# Patient Record
Sex: Male | Born: 1940 | Race: White | Hispanic: No | Marital: Married | Smoking: Former smoker
Health system: Southern US, Community
[De-identification: ages and names within clinical notes are randomized; demographics above are authoritative.]

## PROBLEM LIST (undated history)

## (undated) DIAGNOSIS — E119 Type 2 diabetes mellitus without complications: Secondary | ICD-10-CM

## (undated) DIAGNOSIS — Z77090 Contact with and (suspected) exposure to asbestos: Secondary | ICD-10-CM

## (undated) DIAGNOSIS — J439 Emphysema, unspecified: Secondary | ICD-10-CM

## (undated) HISTORY — PX: APPENDECTOMY: SHX54

---

## 2019-04-11 ENCOUNTER — Other Ambulatory Visit: Payer: Self-pay | Admitting: Family Medicine

## 2019-04-11 ENCOUNTER — Other Ambulatory Visit (HOSPITAL_COMMUNITY): Payer: Self-pay | Admitting: Family Medicine

## 2019-04-11 DIAGNOSIS — R9389 Abnormal findings on diagnostic imaging of other specified body structures: Secondary | ICD-10-CM

## 2019-04-27 ENCOUNTER — Other Ambulatory Visit: Payer: Self-pay

## 2019-04-27 ENCOUNTER — Ambulatory Visit (HOSPITAL_COMMUNITY)
Admission: RE | Admit: 2019-04-27 | Discharge: 2019-04-27 | Disposition: A | Discharge status: Home / Self Care | Payer: Medicare Other | Source: Ambulatory Visit | Attending: Family Medicine | Admitting: Family Medicine

## 2019-04-27 DIAGNOSIS — R9389 Abnormal findings on diagnostic imaging of other specified body structures: Secondary | ICD-10-CM | POA: Diagnosis present

## 2019-08-28 ENCOUNTER — Other Ambulatory Visit (HOSPITAL_COMMUNITY): Payer: Self-pay | Admitting: Family Medicine

## 2019-08-28 ENCOUNTER — Other Ambulatory Visit: Payer: Self-pay | Admitting: Family Medicine

## 2019-08-28 DIAGNOSIS — J929 Pleural plaque without asbestos: Secondary | ICD-10-CM

## 2019-09-06 ENCOUNTER — Other Ambulatory Visit: Payer: Self-pay

## 2019-09-06 ENCOUNTER — Ambulatory Visit (HOSPITAL_COMMUNITY)
Admission: RE | Admit: 2019-09-06 | Discharge: 2019-09-06 | Disposition: A | Discharge status: Home / Self Care | Payer: Medicare Other | Source: Ambulatory Visit | Attending: Family Medicine | Admitting: Family Medicine

## 2019-09-06 DIAGNOSIS — J929 Pleural plaque without asbestos: Secondary | ICD-10-CM | POA: Diagnosis not present

## 2019-10-05 ENCOUNTER — Encounter (HOSPITAL_COMMUNITY): Payer: Self-pay

## 2019-10-05 ENCOUNTER — Inpatient Hospital Stay (HOSPITAL_COMMUNITY)
Admission: EM | Admit: 2019-10-05 | Discharge: 2019-10-07 | DRG: 871 | Disposition: A | Discharge status: Home / Self Care | Payer: Medicare Other | Attending: Family Medicine | Admitting: Family Medicine

## 2019-10-05 ENCOUNTER — Emergency Department (HOSPITAL_COMMUNITY): Payer: Medicare Other

## 2019-10-05 ENCOUNTER — Other Ambulatory Visit: Payer: Self-pay

## 2019-10-05 DIAGNOSIS — J439 Emphysema, unspecified: Secondary | ICD-10-CM | POA: Diagnosis present

## 2019-10-05 DIAGNOSIS — E872 Acidosis, unspecified: Secondary | ICD-10-CM | POA: Diagnosis present

## 2019-10-05 DIAGNOSIS — E871 Hypo-osmolality and hyponatremia: Secondary | ICD-10-CM | POA: Diagnosis present

## 2019-10-05 DIAGNOSIS — J189 Pneumonia, unspecified organism: Principal | ICD-10-CM | POA: Diagnosis present

## 2019-10-05 DIAGNOSIS — Z20822 Contact with and (suspected) exposure to covid-19: Secondary | ICD-10-CM | POA: Diagnosis present

## 2019-10-05 DIAGNOSIS — J449 Chronic obstructive pulmonary disease, unspecified: Secondary | ICD-10-CM | POA: Diagnosis present

## 2019-10-05 DIAGNOSIS — Z77090 Contact with and (suspected) exposure to asbestos: Secondary | ICD-10-CM | POA: Diagnosis present

## 2019-10-05 DIAGNOSIS — J61 Pneumoconiosis due to asbestos and other mineral fibers: Secondary | ICD-10-CM

## 2019-10-05 DIAGNOSIS — A419 Sepsis, unspecified organism: Principal | ICD-10-CM | POA: Diagnosis present

## 2019-10-05 DIAGNOSIS — E119 Type 2 diabetes mellitus without complications: Secondary | ICD-10-CM

## 2019-10-05 DIAGNOSIS — J9601 Acute respiratory failure with hypoxia: Secondary | ICD-10-CM | POA: Diagnosis present

## 2019-10-05 DIAGNOSIS — I959 Hypotension, unspecified: Secondary | ICD-10-CM | POA: Diagnosis present

## 2019-10-05 DIAGNOSIS — E1165 Type 2 diabetes mellitus with hyperglycemia: Secondary | ICD-10-CM | POA: Diagnosis present

## 2019-10-05 HISTORY — DX: Emphysema, unspecified: J43.9

## 2019-10-05 HISTORY — DX: Type 2 diabetes mellitus without complications: E11.9

## 2019-10-05 HISTORY — DX: Contact with and (suspected) exposure to asbestos: Z77.090

## 2019-10-05 LAB — PROTIME-INR
INR: 1.3 — ABNORMAL HIGH (ref 0.8–1.2)
Prothrombin Time: 15.5 seconds — ABNORMAL HIGH (ref 11.4–15.2)

## 2019-10-05 LAB — CBC WITH DIFFERENTIAL/PLATELET
Abs Immature Granulocytes: 0.33 10*3/uL — ABNORMAL HIGH (ref 0.00–0.07)
Basophils Absolute: 0 10*3/uL (ref 0.0–0.1)
Basophils Relative: 0 %
Eosinophils Absolute: 0.1 10*3/uL (ref 0.0–0.5)
Eosinophils Relative: 1 %
HCT: 35.1 % — ABNORMAL LOW (ref 39.0–52.0)
Hemoglobin: 11.6 g/dL — ABNORMAL LOW (ref 13.0–17.0)
Immature Granulocytes: 3 %
Lymphocytes Relative: 7 %
Lymphs Abs: 0.9 10*3/uL (ref 0.7–4.0)
MCH: 28.9 pg (ref 26.0–34.0)
MCHC: 33 g/dL (ref 30.0–36.0)
MCV: 87.5 fL (ref 80.0–100.0)
Monocytes Absolute: 0.8 10*3/uL (ref 0.1–1.0)
Monocytes Relative: 6 %
Neutro Abs: 10.9 10*3/uL — ABNORMAL HIGH (ref 1.7–7.7)
Neutrophils Relative %: 83 %
Platelets: 211 10*3/uL (ref 150–400)
RBC: 4.01 MIL/uL — ABNORMAL LOW (ref 4.22–5.81)
RDW: 14 % (ref 11.5–15.5)
WBC: 13.1 10*3/uL — ABNORMAL HIGH (ref 4.0–10.5)
nRBC: 0 % (ref 0.0–0.2)

## 2019-10-05 LAB — COMPREHENSIVE METABOLIC PANEL
ALT: 28 U/L (ref 0–44)
AST: 30 U/L (ref 15–41)
Albumin: 3.1 g/dL — ABNORMAL LOW (ref 3.5–5.0)
Alkaline Phosphatase: 91 U/L (ref 38–126)
Anion gap: 13 (ref 5–15)
BUN: 42 mg/dL — ABNORMAL HIGH (ref 8–23)
CO2: 19 mmol/L — ABNORMAL LOW (ref 22–32)
Calcium: 8.9 mg/dL (ref 8.9–10.3)
Chloride: 97 mmol/L — ABNORMAL LOW (ref 98–111)
Creatinine, Ser: 1.22 mg/dL (ref 0.61–1.24)
GFR calc Af Amer: >60 mL/min (ref >60)
GFR calc non Af Amer: 56 mL/min — ABNORMAL LOW (ref >60)
Glucose, Bld: 153 mg/dL — ABNORMAL HIGH (ref 70–99)
Potassium: 4.1 mmol/L (ref 3.5–5.1)
Sodium: 129 mmol/L — ABNORMAL LOW (ref 135–145)
Total Bilirubin: 1.6 mg/dL — ABNORMAL HIGH (ref 0.3–1.2)
Total Protein: 7.2 g/dL (ref 6.5–8.1)

## 2019-10-05 LAB — GLUCOSE, CAPILLARY
Glucose-Capillary: 111 mg/dL — ABNORMAL HIGH (ref 70–99)
Glucose-Capillary: 184 mg/dL — ABNORMAL HIGH (ref 70–99)

## 2019-10-05 LAB — HEMOGLOBIN A1C
Hgb A1c MFr Bld: 7.3 % — ABNORMAL HIGH (ref 4.8–5.6)
Mean Plasma Glucose: 162.81 mg/dL

## 2019-10-05 LAB — SARS CORONAVIRUS 2 BY RT PCR (HOSPITAL ORDER, PERFORMED IN ~~LOC~~ HOSPITAL LAB): SARS Coronavirus 2: NEGATIVE

## 2019-10-05 LAB — TROPONIN I (HIGH SENSITIVITY)
Troponin I (High Sensitivity): 26 ng/L — ABNORMAL HIGH (ref <18)
Troponin I (High Sensitivity): 26 ng/L — ABNORMAL HIGH (ref <18)

## 2019-10-05 LAB — LACTIC ACID, PLASMA
Lactic Acid, Venous: 2.4 mmol/L (ref 0.5–1.9)
Lactic Acid, Venous: 2.2 mmol/L (ref 0.5–1.9)
Lactic Acid, Venous: 2.7 mmol/L (ref 0.5–1.9)
Lactic Acid, Venous: 2.1 mmol/L (ref 0.5–1.9)

## 2019-10-05 LAB — BRAIN NATRIURETIC PEPTIDE: B Natriuretic Peptide: 274 pg/mL — ABNORMAL HIGH (ref 0.0–100.0)

## 2019-10-05 LAB — APTT: aPTT: 41 seconds — ABNORMAL HIGH (ref 24–36)

## 2019-10-05 LAB — HIV ANTIBODY (ROUTINE TESTING W REFLEX): HIV Screen 4th Generation wRfx: NONREACTIVE

## 2019-10-05 MED ORDER — LACTATED RINGERS IV BOLUS (SEPSIS)
1000.0000 mL | Freq: Once | INTRAVENOUS | Status: AC
  Administered 2019-10-05: 1000 mL via INTRAVENOUS

## 2019-10-05 MED ORDER — SODIUM CHLORIDE 0.9 % IV SOLN: INTRAVENOUS | Status: AC

## 2019-10-05 MED ORDER — ENOXAPARIN SODIUM 40 MG/0.4ML ~~LOC~~ SOLN
40.0000 mg | SUBCUTANEOUS | Status: DC
  Administered 2019-10-05 – 2019-10-06 (×2): 40 mg via SUBCUTANEOUS
  Filled 2019-10-05 (×2): qty 0.4

## 2019-10-05 MED ORDER — INSULIN ASPART 100 UNIT/ML ~~LOC~~ SOLN
0.0000 [IU] | Freq: Three times a day (TID) | SUBCUTANEOUS | Status: DC
  Administered 2019-10-05 – 2019-10-06 (×2): 3 [IU] via SUBCUTANEOUS
  Administered 2019-10-06: 2 [IU] via SUBCUTANEOUS
  Administered 2019-10-07: 5 [IU] via SUBCUTANEOUS
  Administered 2019-10-07: 2 [IU] via SUBCUTANEOUS

## 2019-10-05 MED ORDER — HYDROCOD POLST-CPM POLST ER 10-8 MG/5ML PO SUER: 2.5000 mL | Freq: Two times a day (BID) | ORAL | Status: DC | PRN

## 2019-10-05 MED ORDER — IPRATROPIUM-ALBUTEROL 0.5-2.5 (3) MG/3ML IN SOLN
3.0000 mL | Freq: Four times a day (QID) | RESPIRATORY_TRACT | Status: DC
  Administered 2019-10-05 – 2019-10-07 (×7): 3 mL via RESPIRATORY_TRACT
  Filled 2019-10-05 (×7): qty 3

## 2019-10-05 MED ORDER — ATORVASTATIN CALCIUM 20 MG PO TABS
20.0000 mg | ORAL_TABLET | Freq: Every evening | ORAL | Status: DC
  Administered 2019-10-05 – 2019-10-06 (×2): 20 mg via ORAL
  Filled 2019-10-05 (×2): qty 1

## 2019-10-05 MED ORDER — SODIUM CHLORIDE 0.9 % IV SOLN
2.0000 g | INTRAVENOUS | Status: DC
  Administered 2019-10-06: 2 g via INTRAVENOUS
  Filled 2019-10-05: qty 20

## 2019-10-05 MED ORDER — GUAIFENESIN ER 600 MG PO TB12
600.0000 mg | ORAL_TABLET | Freq: Two times a day (BID) | ORAL | Status: DC
  Administered 2019-10-05 – 2019-10-07 (×4): 600 mg via ORAL
  Filled 2019-10-05 (×4): qty 1

## 2019-10-05 MED ORDER — SODIUM CHLORIDE 0.9 % IV SOLN
500.0000 mg | Freq: Once | INTRAVENOUS | Status: AC
  Administered 2019-10-05: 500 mg via INTRAVENOUS
  Filled 2019-10-05: qty 500

## 2019-10-05 MED ORDER — AZITHROMYCIN 250 MG PO TABS
500.0000 mg | ORAL_TABLET | Freq: Every day | ORAL | Status: DC
  Administered 2019-10-06 – 2019-10-07 (×2): 500 mg via ORAL
  Filled 2019-10-05 (×2): qty 2

## 2019-10-05 MED ORDER — LACTATED RINGERS IV BOLUS (SEPSIS): 1000.0000 mL | Freq: Once | INTRAVENOUS | Status: DC

## 2019-10-05 MED ORDER — INSULIN ASPART 100 UNIT/ML ~~LOC~~ SOLN
0.0000 [IU] | Freq: Every day | SUBCUTANEOUS | Status: DC
  Administered 2019-10-06: 2 [IU] via SUBCUTANEOUS

## 2019-10-05 MED ORDER — SODIUM CHLORIDE 0.9 % IV SOLN
1.0000 g | Freq: Once | INTRAVENOUS | Status: AC
  Administered 2019-10-05: 1 g via INTRAVENOUS
  Filled 2019-10-05: qty 10

## 2019-10-05 MED ORDER — SODIUM CHLORIDE 0.9 % IV BOLUS: 1000.0000 mL | Freq: Once | INTRAVENOUS | Status: DC

## 2019-10-05 MED ORDER — PAROXETINE HCL 20 MG PO TABS
20.0000 mg | ORAL_TABLET | Freq: Every day | ORAL | Status: DC
  Administered 2019-10-05 – 2019-10-07 (×3): 20 mg via ORAL
  Filled 2019-10-05 (×3): qty 1

## 2019-10-05 NOTE — ED Notes (Signed)
CRITICAL VALUE ALERT  Critical Value: Lactic Acid 2.7 Date & Time Notied:  10/05/19 1550  Provider Notified: Dr Maryln Manuel Orders Received/Actions taken: No orders received at this time

## 2019-10-05 NOTE — ED Notes (Signed)
CRITICAL VALUE ALERT  Critical Value:  Lactic Acid 2.4  Date & Time Notied:  10/05/19 1004  Provider Notified: Dr. Judd Lien  Orders Received/Actions taken: EDP notified

## 2019-10-05 NOTE — ED Triage Notes (Signed)
Pt reports sob and cough x 4 days.  Pt says hurts to breathe.  Pt has had both covid vaccines.

## 2019-10-05 NOTE — H&P (Addendum)
History and Physical  [facility]  Dominic [name]:[id number] DOB: 1940-06-05 DOA: 10/05/2019  PCP: Alinda Deem, MD  Patient coming from: Home   I have personally briefly reviewed patient's old medical records in [facility]  Chief Complaint: cough   HPI: Dominic [name] is a 79 y.o. male with medical history significant for COPD (emphysema), type 2 diabetes mellitus, asbestos exposure who presented to ED from home with 4 days of dry hacking cough, chest congestion, shortness of breath, pleuritic chest wall pain, low grade fever and malaise.  Pt reports that he has had both covid vaccines and is fully vaccinated for covid 19.  He reports that his exercise tolerance is diminished and he feels overall much weaker over last several days.    ED Course: pt was tachypneic and hypoxic with pulse ox of 88%, placed on 2L [place] with improvement, temperature 99.7, respirations 23, blood pressure 112/66 pulse ox improved to 95% on 2 L nasal cannula, sodium 1, potassium 4.1, glucose 153, creatinine 1.22, total bilirubin 1.6, BNP 274.0, troponin XX 6, lactic acid 2.4, WBC 13.1, hemoglobin 11.6, platelet count 211.  SARS 2 COVID-19 test negative, portable chest x-ray with patchy left lower lobe pneumonia findings.  Blood cultures x2 taken, IV antibiotics started and admission requested.  Review of Systems: As per HPI otherwise 10 point review of systems negative.   Past Medical History:  Diagnosis Date  . Asbestos exposure   . Diabetes mellitus without complication (HCC)   . Emphysema lung (HCC)     Past Surgical History:  Procedure Laterality Date  . APPENDECTOMY       reports that he has never smoked. He has never used smokeless tobacco. He reports that he does not drink alcohol and does not use drugs.  No Known Allergies  History reviewed. No pertinent family history.  Prior to Admission medications   Not on File    Physical Exam: Vitals:   10/05/19 1000 10/05/19 1030  10/05/19 1100 10/05/19 1130  BP: (!) 93/59 105/65 (!) 90/57 108/65  Pulse: 87 88 88 85  Resp: (!) 25 (!) 25 20 (!) 24  Temp:      TempSrc:      SpO2: 95% 93% 95% 94%  Weight:      Height:       Constitutional: elderly frail male, lying supine on bed, speaking regular sentences, NAD, calm, comfortable Eyes: PERRL, lids and conjunctivae normal ENMT: Mucous membranes are moist. Posterior pharynx clear of any exudate or lesions.Normal dentition.  Neck: normal, supple, no masses, no thyromegaly Respiratory: rales LLL no wheezing, no crackles. Normal respiratory effort. No accessory muscle use.  Cardiovascular: normal s1,s2 sounds,  no murmurs / rubs / gallops. No extremity edema. 2+ pedal pulses. No carotid bruits.  Abdomen: no tenderness, no masses palpated. No hepatosplenomegaly. Bowel sounds positive.  Musculoskeletal: no clubbing / cyanosis. No joint deformity upper and lower extremities. Good ROM, no contractures. Normal muscle tone.  Skin: no rashes, lesions, ulcers. No induration Neurologic: CN 2-12 grossly intact. Sensation intact, DTR normal. Strength 5/5 in all 4.  Psychiatric: Normal judgment and insight. Alert and oriented x 3. Normal mood.   Labs on Admission: I have personally reviewed following labs and imaging studies  CBC: Recent Labs  Lab 10/05/19 0843  WBC 13.1*  NEUTROABS 10.9*  HGB 11.6*  HCT 35.1*  MCV 87.5  PLT 211   Basic Metabolic Panel: Recent Labs  Lab 10/05/19 0843  NA 129*  K 4.1  CL 97*  CO2 19*  GLUCOSE 153*  BUN 42*  CREATININE 1.22  CALCIUM 8.9   GFR: Estimated Creatinine Clearance: 55.7 mL/min (by C-G formula based on SCr of 1.22 mg/dL). Liver Function Tests: Recent Labs  Lab 10/05/19 0843  AST 30  ALT 28  ALKPHOS 91  BILITOT 1.6*  PROT 7.2  ALBUMIN 3.1*   No results for input(s): LIPASE, AMYLASE in the last 168 hours. No results for input(s): AMMONIA in the last 168 hours. Coagulation Profile: No results for input(s): INR,  PROTIME in the last 168 hours. Cardiac Enzymes: No results for input(s): CKTOTAL, CKMB, CKMBINDEX, TROPONINI in the last 168 hours. BNP (last 3 results) No results for input(s): PROBNP in the last 8760 hours. HbA1C: No results for input(s): HGBA1C in the last 72 hours. CBG: No results for input(s): GLUCAP in the last 168 hours. Lipid Profile: No results for input(s): CHOL, HDL, LDLCALC, TRIG, CHOLHDL, LDLDIRECT in the last 72 hours. Thyroid Function Tests: No results for input(s): TSH, T4TOTAL, FREET4, T3FREE, THYROIDAB in the last 72 hours. Anemia Panel: No results for input(s): VITAMINB12, FOLATE, FERRITIN, TIBC, IRON, RETICCTPCT in the last 72 hours. Urine analysis: No results found for: COLORURINE, APPEARANCEUR, LABSPEC, PHURINE, GLUCOSEU, HGBUR, BILIRUBINUR, KETONESUR, PROTEINUR, UROBILINOGEN, NITRITE, LEUKOCYTESUR  Radiological Exams on Admission: DG Chest Portable 1 View  Result Date: 10/05/2019 CLINICAL DATA:  Shortness of breath and cough for 4 days. EXAM: PORTABLE CHEST 1 VIEW COMPARISON:  Chest CT 09/06/2019 FINDINGS: The cardiomediastinal silhouette is within normal limits. 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 Right-sided pleural thickening and small volume loculated pleural fluid are similar to the prior CT. Right basilar parenchymal lung opacities likely correspond to scarring on the CT. There is patchy opacity in the retrocardiac left lower lobe which appears new from the prior CT. There is scarring in the left upper lobe. No pneumothorax or acute osseous abnormality is identified. IMPRESSION: 1. Patchy left lower lobe opacity concerning for pneumonia. 2. Bilateral lung scarring, calcified pleural plaques, and right-sided pleural thickening and small loculated pleural effusion. Electronically Signed   By: Sebastian Ache M.D.   On: 10/05/2019 08:28   Assessment/Plan Principal Problem:   CAP (community acquired pneumonia) Active Problems:   Diabetes mellitus  without complication (HCC)   Emphysema lung (HCC)   Acute respiratory failure with hypoxia (HCC)   Asbestos exposure   1. Acute respiratory failure with hypoxia secondary to community-acquired pneumonia-with patient's advanced age and comorbidities and high mortality risk he is being admitted inpatient for IV antibiotics and supportive care.  Continue Rocephin and azithromycin.  Follow blood cultures.  Continue supplemental oxygen as needed wean as possible. 2. Type 2 diabetes mellitus-check hemoglobin A1c, supplemental sliding scale insulin and CBG monitoring. 3. COPD-duo nebs ordered every 6 hours, mucolytic's and respiratory care. 4. Sepsis secondary to pneumonia -patient presented with tachypnea, hypotension, community-acquired pneumonia and high lactic acid levels.  He is being treated with the sepsis protocol and will follow lactate levels. 5. Lactic acidosis-he is being treated with sepsis protocol IV fluids and follow-up lactic acid levels ordered. 6. Hypotension - BP improved after IV fluid bolus.    DVT prophylaxis: Lovenox Code Status: Full Family Communication: No family present during rounds Disposition Plan: Home Consults called: N/A Admission status: INP  Tarvis Blossom MD [facility] How to contact the [facility] Attending or Consulting provider 7A - 7P or covering provider during after hours 7P -7A, for this patient?  1. Check the care team in [facility]  and look for a) attending/consulting [facility] provider listed and b) the [facility] team listed 2. Log into [email or web address] and use [place]'s universal password to access. If you do not have the password, please contact the hospital operator. 3. Locate the [facility] provider you are looking for under [facility] and page to a number that you can be directly reached. 4. If you still have difficulty reaching the provider, please page the [facility] (Director on Call) for the Hospitalists listed on amion for assistance.  If 7PM-7AM, please  contact night-coverage [email or web address] Password [facility]  10/05/2019, 12:21 PM

## 2019-10-05 NOTE — ED Notes (Signed)
Pt stood up to urinate at the side of the bed and pt remained on 2L of O2 via [place] the entire time. Pt's O2 sat dropped to 88% while standing. Pt returned to bed with 2L O2 still on with O2 sats rising back to normal.

## 2019-10-05 NOTE — ED Provider Notes (Signed)
[facility] EMERGENCY DEPARTMENT Provider Note   CSN: [id number] Arrival date & time: 10/05/19  2263     History Chief Complaint  Patient presents with  . Shortness of Breath    Dominic [name] is a 79 y.o. male.  Patient is an 79 year old male with past medical history of diabetes, asbestosis.  He presents today for evaluation of shortness of breath and cough.  This has been worsening over the past 4 days.  His cough is nonproductive.  He describes pain with inspiration.  He denies any swelling or tenderness in his legs.  He denies fevers or chills.  Patient does report having his Covid vaccine several months ago.  The history is provided by the patient.  Shortness of Breath Severity:  Moderate Onset quality:  Gradual Duration:  4 days Timing:  Constant Progression:  Worsening Chronicity:  New Context: URI   Relieved by:  Nothing Worsened by:  Deep breathing and movement Ineffective treatments:  None tried Associated symptoms: no fever        Past Medical History:  Diagnosis Date  . Asbestos exposure   . Diabetes mellitus without complication (HCC)   . Emphysema lung (HCC)     There are no problems to display for this patient.   Past Surgical History:  Procedure Laterality Date  . APPENDECTOMY         No family history on file.  Social History   Tobacco Use  . Smoking status: Never Smoker  . Smokeless tobacco: Never Used  Substance Use Topics  . Alcohol use: Never  . Drug use: Never    Home Medications Prior to Admission medications   Not on File    Allergies    Patient has no known allergies.  Review of Systems   Review of Systems  Constitutional: Negative for fever.  Respiratory: Positive for shortness of breath.   All other systems reviewed and are negative.   Physical Exam Updated Vital Signs BP 100/62   Pulse 98   Temp 99.7 F (37.6 C) (Oral)   Resp (!) 29   Ht 5\' 11"  (1.803 m)   Wt 90.7 kg   SpO2 95%   BMI 27.89 kg/m    Physical Exam Vitals and nursing note reviewed.  Constitutional:      General: He is not in acute distress.    Appearance: He is well-developed. He is not diaphoretic.  HENT:     Head: Normocephalic and atraumatic.  Cardiovascular:     Rate and Rhythm: Normal rate and regular rhythm.     Heart sounds: No murmur heard.  No friction rub.  Pulmonary:     Effort: Pulmonary effort is normal. No respiratory distress.     Breath sounds: Examination of the left-middle field reveals rales. Examination of the left-lower field reveals rales. Rales present. No wheezing.  Abdominal:     General: Bowel sounds are normal. There is no distension.     Palpations: Abdomen is soft.     Tenderness: There is no abdominal tenderness.  Musculoskeletal:        General: Normal range of motion.     Cervical back: Normal range of motion and neck supple.     Right lower leg: No tenderness. No edema.     Left lower leg: No tenderness. No edema.  Skin:    General: Skin is warm and dry.  Neurological:     Mental Status: He is alert and oriented to person, place, and time.  Coordination: Coordination normal.     ED Results / Procedures / Treatments   Labs (all labs ordered are listed, but only abnormal results are displayed) Labs Reviewed  CULTURE, BLOOD (ROUTINE X 2)  CULTURE, BLOOD (ROUTINE X 2)  SARS CORONAVIRUS 2 BY RT PCR (HOSPITAL ORDER, PERFORMED IN [facility])  CBC WITH DIFFERENTIAL/PLATELET  COMPREHENSIVE METABOLIC PANEL  LACTIC ACID, PLASMA  LACTIC ACID, PLASMA  BRAIN NATRIURETIC PEPTIDE  TROPONIN I (HIGH SENSITIVITY)    EKG EKG Interpretation  Date/Time:  Thursday October 05 2019 08:03:38 EDT Ventricular Rate:  113 PR Interval:    QRS Duration: 99 QT Interval:  318 QTC Calculation: 436 R Axis:   -42 Text Interpretation: Atrial fibrillation Left anterior fascicular block Confirmed by Geoffery Lyons ([id number]) on 10/05/2019 8:47:10 AM   Radiology DG Chest Portable 1  View  Result Date: 10/05/2019 CLINICAL DATA:  Shortness of breath and cough for 4 days. EXAM: PORTABLE CHEST 1 VIEW COMPARISON:  Chest CT 09/06/2019 FINDINGS: The cardiomediastinal silhouette is within normal limits. 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 Right-sided pleural thickening and small volume loculated pleural fluid are similar to the prior CT. Right basilar parenchymal lung opacities likely correspond to scarring on the CT. There is patchy opacity in the retrocardiac left lower lobe which appears new from the prior CT. There is scarring in the left upper lobe. No pneumothorax or acute osseous abnormality is identified. IMPRESSION: 1. Patchy left lower lobe opacity concerning for pneumonia. 2. Bilateral lung scarring, calcified pleural plaques, and right-sided pleural thickening and small loculated pleural effusion. Electronically Signed   By: Sebastian Ache M.D.   On: 10/05/2019 08:28    Procedures Procedures (including critical care time)  Medications Ordered in ED Medications - No data to display  ED Course  I have reviewed the triage vital signs and the nursing notes.  Pertinent labs & imaging results that were available during my care of the patient were reviewed by me and considered in my medical decision making (see chart for details).    MDM Rules/Calculators/A&P  Patient is an 79 year old male presenting with complaints of shortness of breath and cough that is worsening over the past several days.  Patient's work-up reveals a left lower lobe infiltrate.  He is hypoxic with oxygen saturations of 88% and I feel will require admission.  Patient given IV antibiotics and will be admitted to the hospitalist service.  Covid test is negative.  Dominic [name] was evaluated in Emergency Department on 10/06/2019 for the symptoms described in the history of present illness. He was evaluated in the context of the global COVID-19 pandemic, which necessitated consideration that  the patient might be at risk for infection with the SARS-CoV-2 virus that causes COVID-19. Institutional protocols and algorithms that pertain to the evaluation of patients at risk for COVID-19 are in a state of rapid change based on information released by regulatory bodies including the CDC and federal and state organizations. These policies and algorithms were followed during the patient's care in the ED.   Final Clinical Impression(s) / ED Diagnoses Final diagnoses:  None    Rx / DC Orders ED Discharge Orders    None       Geoffery Lyons, MD 10/06/19 [phone]

## 2019-10-06 LAB — BASIC METABOLIC PANEL
Anion gap: 8 (ref 5–15)
BUN: 22 mg/dL (ref 8–23)
CO2: 24 mmol/L (ref 22–32)
Calcium: 8.6 mg/dL — ABNORMAL LOW (ref 8.9–10.3)
Chloride: 105 mmol/L (ref 98–111)
Creatinine, Ser: 0.76 mg/dL (ref 0.61–1.24)
GFR calc Af Amer: >60 mL/min (ref >60)
GFR calc non Af Amer: >60 mL/min (ref >60)
Glucose, Bld: 115 mg/dL — ABNORMAL HIGH (ref 70–99)
Potassium: 4.3 mmol/L (ref 3.5–5.1)
Sodium: 137 mmol/L (ref 135–145)

## 2019-10-06 LAB — CBC WITH DIFFERENTIAL/PLATELET
Abs Immature Granulocytes: 0.07 10*3/uL (ref 0.00–0.07)
Basophils Absolute: 0 10*3/uL (ref 0.0–0.1)
Basophils Relative: 0 %
Eosinophils Absolute: 0 10*3/uL (ref 0.0–0.5)
Eosinophils Relative: 1 %
HCT: 30.7 % — ABNORMAL LOW (ref 39.0–52.0)
Hemoglobin: 9.7 g/dL — ABNORMAL LOW (ref 13.0–17.0)
Immature Granulocytes: 1 %
Lymphocytes Relative: 12 %
Lymphs Abs: 0.9 10*3/uL (ref 0.7–4.0)
MCH: 28.6 pg (ref 26.0–34.0)
MCHC: 31.6 g/dL (ref 30.0–36.0)
MCV: 90.6 fL (ref 80.0–100.0)
Monocytes Absolute: 0.8 10*3/uL (ref 0.1–1.0)
Monocytes Relative: 11 %
Neutro Abs: 6 10*3/uL (ref 1.7–7.7)
Neutrophils Relative %: 75 %
Platelets: 191 10*3/uL (ref 150–400)
RBC: 3.39 MIL/uL — ABNORMAL LOW (ref 4.22–5.81)
RDW: 14.2 % (ref 11.5–15.5)
WBC: 7.9 10*3/uL (ref 4.0–10.5)
nRBC: 0 % (ref 0.0–0.2)

## 2019-10-06 LAB — GLUCOSE, CAPILLARY
Glucose-Capillary: 119 mg/dL — ABNORMAL HIGH (ref 70–99)
Glucose-Capillary: 192 mg/dL — ABNORMAL HIGH (ref 70–99)
Glucose-Capillary: 122 mg/dL — ABNORMAL HIGH (ref 70–99)
Glucose-Capillary: 214 mg/dL — ABNORMAL HIGH (ref 70–99)

## 2019-10-06 LAB — STREP PNEUMONIAE URINARY ANTIGEN: Strep Pneumo Urinary Antigen: NEGATIVE

## 2019-10-06 LAB — MAGNESIUM: Magnesium: 1.8 mg/dL (ref 1.7–2.4)

## 2019-10-06 NOTE — Evaluation (Signed)
Physical Therapy Evaluation Patient Details Name: Dominic [name] MRN: [id number] DOB: 1940-10-22 Today's Date: 10/06/2019   History of Present Illness  Dominic [name] is a 79 y.o. male with medical history significant for COPD (emphysema), type 2 diabetes mellitus, asbestos exposure who presented to ED from home with 4 days of dry hacking cough, chest congestion, shortness of breath, pleuritic chest wall pain, low grade fever and malaise.  Pt reports that he has had both covid vaccines and is fully vaccinated for covid 19.  He reports that his exercise tolerance is diminished and he feels overall much weaker over last several days.    Clinical Impression  Patient functioning near baseline for functional mobility and gait but demonstrates minimal weakness and unsteadiness with gait. Patient O2 sat monitored throughout session today and patient was on 4L initially with saturation between 87-92% while seated EOB. O2 sat while ambulating on 2L decreased to 85% which then increased to 90% on 4L while ambulating. Upon resting, O2 increased to 94% on 4L seated EOB. Patient was limited by fatigue with ambulating today demonstrating impaired activity tolerance. Patient limited for functional mobility as stated below secondary to BLE weakness, fatigue and poor standing balance.  Patient will benefit from continued physical therapy in hospital and recommended venue below to increase strength, balance, endurance for safe ADLs and gait.      Follow Up Recommendations No PT follow up    Equipment Recommendations  None recommended by PT    Recommendations for Other Services       Precautions / Restrictions Precautions Precautions: Fall Restrictions Weight Bearing Restrictions: No      Mobility  Bed Mobility Overal bed mobility: Modified Independent             General bed mobility comments: slightly slow transition to seated EOB with HOB elevated, O2 set at 4LPM upon enterance for session - O2  sat 97-92 while seated  Transfers Overall transfer level: Needs assistance Equipment used: None Transfers: Sit to/from [employer] Sit to Stand: Supervision Stand pivot transfers: Supervision       General transfer comment: minimally unsteady upon standing  Ambulation/Gait Ambulation/Gait assistance: Min guard Gait Distance (Feet): 100 Feet Assistive device: None Gait Pattern/deviations: Step-through pattern;Decreased step length - right;Decreased step length - left;Decreased stride length Gait velocity: decreased   General Gait Details: slightly slow, labored gait without AD; O2 sat monitored at 85% on 2L while ambualting which increased up to 90% on 4L while ambulating, increased to 94 upon returning to seated on 4L  Stairs            Wheelchair Mobility    Modified Rankin (Stroke Patients Only)       Balance Overall balance assessment: Needs assistance Sitting-balance support: No upper extremity supported;Feet supported Sitting balance-Leahy Scale: Good Sitting balance - Comments: seated EOB   Standing balance support: No upper extremity supported Standing balance-Leahy Scale: Fair Standing balance comment: without AD                             Pertinent Vitals/Pain Pain Assessment: No/denies pain    Home Living Family/patient expects to be discharged to:: Private residence Living Arrangements: Spouse/significant other;Children Available Help at Discharge: Family;Available 24 hours/day Type of Home: House       Home Layout: Two level;Able to live on main level with bedroom/bathroom Home Equipment: Dan Humphreys - 2 wheels;Cane - single point;Crutches      Prior Function  Level of Independence: Independent         Comments: Patient states independent with ADL, limited community ambulator, takes care of wife, not on home O2     Hand Dominance        Extremity/Trunk Assessment   Upper Extremity Assessment Upper  Extremity Assessment: Overall WFL for tasks assessed    Lower Extremity Assessment Lower Extremity Assessment: Overall WFL for tasks assessed    Cervical / Trunk Assessment Cervical / Trunk Assessment: Normal  Communication   Communication: No difficulties  Cognition Arousal/Alertness: Awake/alert Behavior During Therapy: WFL for tasks assessed/performed Overall Cognitive Status: Within Functional Limits for tasks assessed                                        General Comments      Exercises     Assessment/Plan    PT Assessment Patient needs continued PT services  PT Problem List Decreased strength;Decreased activity tolerance;Decreased balance;Decreased mobility;Cardiopulmonary status limiting activity       PT Treatment Interventions DME instruction;Balance training;Gait training;Neuromuscular re-education;Stair training;Functional mobility training;Patient/family education;Therapeutic activities;Therapeutic exercise    PT Goals (Current goals can be found in the Care Plan section)  Acute Rehab PT Goals Patient Stated Goal: Return home with family PT Goal Formulation: With patient Time For Goal Achievement: 10/20/19 Potential to Achieve Goals: Good    Frequency Min 3X/week   Barriers to discharge        Co-evaluation               AM-PAC PT "6 Clicks" Mobility  Outcome Measure Help needed turning from your back to your side while in a flat bed without using bedrails?: None Help needed moving from lying on your back to sitting on the side of a flat bed without using bedrails?: None Help needed moving to and from a bed to a chair (including a wheelchair)?: None Help needed standing up from a chair using your arms (e.g., wheelchair or bedside chair)?: None Help needed to walk in hospital room?: A Little Help needed climbing 3-5 steps with a railing? : A Little 6 Click Score: 22    End of Session Equipment Utilized During Treatment: Gait  belt;Oxygen Activity Tolerance: Patient tolerated treatment well;Patient limited by fatigue Patient left: in bed;with family/visitor present;with call bell/phone within reach Nurse Communication: Mobility status PT Visit Diagnosis: Unsteadiness on feet (R26.81);Other abnormalities of gait and mobility (R26.89);Muscle weakness (generalized) (M62.81)    Time: 6834-1962 PT Time Calculation (min) (ACUTE ONLY): 15 min   Charges:   PT Evaluation $PT Eval Moderate Complexity: 1 Mod         10:07 AM, 10/06/19 Wyman Songster PT, DPT Physical Therapist at [facility]

## 2019-10-06 NOTE — Progress Notes (Signed)
PROGRESS NOTE   Dominic [name]  BSW:[id number] DOB: 09-21-1940 DOA: 10/05/2019 PCP: Alinda Deem, MD   Chief Complaint  Patient presents with  . Shortness of Breath    Brief Admission History:  79 y.o. male with medical history significant for COPD (emphysema), type 2 diabetes mellitus, asbestos exposure who presented to ED from home with 4 days of dry hacking cough, chest congestion, shortness of breath, pleuritic chest wall pain, low grade fever and malaise.  Pt reports that he has had both covid vaccines and is fully vaccinated for covid 19.  He reports that his exercise tolerance is diminished and he feels overall much weaker over last several days.    Assessment & Plan:   Principal Problem:   CAP (community acquired pneumonia) Active Problems:   Diabetes mellitus without complication (HCC)   Emphysema lung (HCC)   Acute respiratory failure with hypoxia (HCC)   Asbestos exposure   Sepsis due to pneumonia (HCC)   Lactic acidosis   Hyponatremia   Hypotension   1. Acute respiratory failure with hypoxia secondary to community-acquired pneumonia-with patient's advanced age and comorbidities and high mortality risk he is being admitted inpatient for IV antibiotics and supportive care.  Continue Rocephin and azithromycin.  he is clinically improving.  Anticipate DC home 7/24.  Follow blood cultures.  Wean oxygen as possible. 2. Poorly controlled Type 2 diabetes mellitus-as evidenced by hemoglobin A1c 7.3%, continue supplemental sliding scale insulin and CBG monitoring. 3. COPD-duo nebs ordered every 6 hours, mucolytics and respiratory care. 4. Sepsis secondary to pneumonia -patient presented with tachypnea, hypotension, community-acquired pneumonia and high lactic acid levels.  He was treated with the sepsis protocol and he is much improved today. 5. Lactic acidosis-treated and resolved.  6. Hypotension - BP improved after IV fluid bolus.    DVT prophylaxis: Lovenox Code Status:  Full  Family Communication: grandson at bedside updated Disposition Plan: Home Consults called: N/A Admission status: INP  Status is: Inpatient  Remains inpatient appropriate because:IV treatments appropriate due to intensity of illness or inability to take PO and Inpatient level of care appropriate due to severity of illness  Dispo: The patient is from: Home              Anticipated d/c is to: Home              Anticipated d/c date is: 1 day              Patient currently is medically stable to d/c.  Consultants:  N/a  Procedures:   n/a  Antimicrobials:  Ceftriaxone 7/22>> Azithromycin 7/22>>  Subjective: Pt says he feels like his breathing is better.  He is less SOB today.  No chest pain.  He is ambulating with PT.   Objective: Vitals:   10/06/19 0221 10/06/19 0804 10/06/19 1047 10/06/19 1423  BP: 104/65  (!) 95/63   Pulse: 73  83   Resp: 18  18   Temp: 97.8 F (36.6 C)     TempSrc: Oral     SpO2: 99% 97% 95% 95%  Weight:      Height:        Intake/Output Summary (Last 24 hours) at 10/06/2019 1753 Last data filed at 10/06/2019 0606 Gross per 24 hour  Intake 291 ml  Output 1600 ml  Net -1309 ml   Filed Weights   10/05/19 0800  Weight: 90.7 kg    Examination:  General exam: Appears calm and comfortable  Respiratory system: Clear to auscultation.  Respiratory effort normal. Cardiovascular system: S1 & S2 heard, RRR. No JVD, murmurs, rubs, gallops or clicks. No pedal edema. Gastrointestinal system: Abdomen is nondistended, soft and nontender. No organomegaly or masses felt. Normal bowel sounds heard. Central nervous system: Alert and oriented. No focal neurological deficits. Extremities: Symmetric 5 x 5 power. Skin: No rashes, lesions or ulcers Psychiatry: Judgement and insight appear normal. Mood & affect appropriate.   Data Reviewed: I have personally reviewed following labs and imaging studies  CBC: Recent Labs  Lab 10/05/19 0843 10/06/19 0427    WBC 13.1* 7.9  NEUTROABS 10.9* 6.0  HGB 11.6* 9.7*  HCT 35.1* 30.7*  MCV 87.5 90.6  PLT 211 191    Basic Metabolic Panel: Recent Labs  Lab 10/05/19 0843 10/06/19 0427  NA 129* 137  K 4.1 4.3  CL 97* 105  CO2 19* 24  GLUCOSE 153* 115*  BUN 42* 22  CREATININE 1.22 0.76  CALCIUM 8.9 8.6*  MG  --  1.8    GFR: Estimated Creatinine Clearance: 84.9 mL/min (by C-G formula based on SCr of 0.76 mg/dL).  Liver Function Tests: Recent Labs  Lab 10/05/19 0843  AST 30  ALT 28  ALKPHOS 91  BILITOT 1.6*  PROT 7.2  ALBUMIN 3.1*    CBG: Recent Labs  Lab 10/05/19 1711 10/05/19 2350 10/06/19 0757 10/06/19 1218 10/06/19 1723  GLUCAP 184* 111* 119* 192* 122*    Recent Results (from the past 240 hour(s))  Blood culture (routine x 2)     Status: None (Preliminary result)   Collection Time: 10/05/19  8:43 AM   Specimen: Right Antecubital; Blood  Result Value Ref Range Status   Specimen Description RIGHT ANTECUBITAL  Final   Special Requests   Final    BOTTLES DRAWN AEROBIC AND ANAEROBIC Blood Culture adequate volume   Culture   Final    NO GROWTH < 24 HOURS Performed at [facility], [address], [place], [place] [address]    Report Status PENDING  Incomplete  Blood culture (routine x 2)     Status: None (Preliminary result)   Collection Time: 10/05/19  8:45 AM   Specimen: BLOOD LEFT WRIST  Result Value Ref Range Status   Specimen Description BLOOD LEFT WRIST  Final   Special Requests   Final    BOTTLES DRAWN AEROBIC AND ANAEROBIC Blood Culture results may not be optimal due to an excessive volume of blood received in culture bottles   Culture   Final    NO GROWTH < 24 HOURS Performed at [facility], [address], [place], [place] [address]    Report Status PENDING  Incomplete  SARS Coronavirus 2 by RT PCR (hospital order, performed in [facility] hospital lab) Nasopharyngeal Nasopharyngeal Swab     Status: None   Collection Time: 10/05/19  9:04 AM   Specimen:  Nasopharyngeal Swab  Result Value Ref Range Status   SARS Coronavirus 2 NEGATIVE NEGATIVE Final    Comment: (NOTE) SARS-CoV-2 target nucleic acids are NOT DETECTED.  The SARS-CoV-2 RNA is generally detectable in upper and lower respiratory specimens during the acute phase of infection. The lowest concentration of SARS-CoV-2 viral copies this assay can detect is 250 copies / mL. A negative result does not preclude SARS-CoV-2 infection and should not be used as the sole basis for treatment or other patient management decisions.  A negative result may occur with improper specimen collection / handling, submission of specimen other than nasopharyngeal swab, presence of viral mutation(s)  within the areas targeted by this assay, and inadequate number of viral copies (<250 copies / mL). A negative result must be combined with clinical observations, patient history, and epidemiological information.  Fact Sheet for Patients:   BoilerBrush.com.cy  Fact Sheet for Healthcare Providers: https://pope.com/  This test is not yet approved or  cleared by the [place] FDA and has been authorized for detection and/or diagnosis of SARS-CoV-2 by FDA under an Emergency Use Authorization (EUA).  This EUA will remain in effect (meaning this test can be used) for the duration of the COVID-19 declaration under Section 564(b)(1) of the Act, 21 U.S.C. section 360bbb-3(b)(1), unless the authorization is terminated or revoked sooner.  Performed at [facility], [address], [place], [place] [address]      Radiology Studies: DG Chest Portable 1 View  Result Date: 10/05/2019 CLINICAL DATA:  Shortness of breath and cough for 4 days. EXAM: PORTABLE CHEST 1 VIEW COMPARISON:  Chest CT 09/06/2019 FINDINGS: The cardiomediastinal silhouette is within normal limits. 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 Right-sided pleural thickening and small  volume loculated pleural fluid are similar to the prior CT. Right basilar parenchymal lung opacities likely correspond to scarring on the CT. There is patchy opacity in the retrocardiac left lower lobe which appears new from the prior CT. There is scarring in the left upper lobe. No pneumothorax or acute osseous abnormality is identified. IMPRESSION: 1. Patchy left lower lobe opacity concerning for pneumonia. 2. Bilateral lung scarring, calcified pleural plaques, and right-sided pleural thickening and small loculated pleural effusion. Electronically Signed   By: Sebastian Ache M.D.   On: 10/05/2019 08:28   Scheduled Meds: . atorvastatin  20 mg Oral QPM  . azithromycin  500 mg Oral Daily  . enoxaparin (LOVENOX) injection  40 mg Subcutaneous Q24H  . guaiFENesin  600 mg Oral BID  . insulin aspart  0-15 Units Subcutaneous TID WC  . insulin aspart  0-5 Units Subcutaneous QHS  . ipratropium-albuterol  3 mL Nebulization Q6H  . PARoxetine  20 mg Oral Daily   Continuous Infusions: . cefTRIAXone (ROCEPHIN)  IV 2 g (10/06/19 1244)     LOS: 1 day   Time spent: 22 mins   Natanel Snavely Laural Benes, MD How to contact the [facility] Attending or Consulting provider 7A - 7P or covering provider during after hours 7P -7A, for this patient?  1. Check the care team in [facility] and look for a) attending/consulting TRH provider listed and b) the [facility] team listed 2. Log into [email or web address] and use [facility] universal password to access. If you do not have the password, please contact the hospital operator. 3. Locate the [facility] provider you are looking for under [facility] and page to a number that you can be directly reached. 4. If you still have difficulty reaching the provider, please page the [facility] (Director on Call) for the Hospitalists listed on amion for assistance.  10/06/2019, 5:53 PM

## 2019-10-06 NOTE — Care Management Important Message (Signed)
Important Message  Patient Details  Name: Dominic [name] MRN: [id number] Date of Birth: [birth year]   Medicare Important Message Given:  Yes     Corey Harold 10/06/2019, 2:19 PM

## 2019-10-06 NOTE — Plan of Care (Signed)
  Problem: Acute Rehab PT Goals(only PT should resolve) Goal: Patient Will Transfer Sit To/From Stand Outcome: Progressing Flowsheets (Taken 10/06/2019 1008) Patient will transfer sit to/from stand: Independently Goal: Pt Will Transfer Bed To Chair/Chair To Bed Outcome: Progressing Flowsheets (Taken 10/06/2019 1008) Pt will Transfer Bed to Chair/Chair to Bed: Independently Goal: Pt Will Ambulate Outcome: Progressing Flowsheets (Taken 10/06/2019 1008) Pt will Ambulate:  > 125 feet  with modified independence Goal: Pt/caregiver will Perform Home Exercise Program Outcome: Progressing Flowsheets (Taken 10/06/2019 1008) Pt/caregiver will Perform Home Exercise Program:  For increased strengthening  For improved balance  Independently  10:09 AM, 10/06/19 Wyman Songster PT, DPT Physical Therapist at [facility]

## 2019-10-07 DIAGNOSIS — E871 Hypo-osmolality and hyponatremia: Secondary | ICD-10-CM

## 2019-10-07 LAB — GLUCOSE, CAPILLARY
Glucose-Capillary: 135 mg/dL — ABNORMAL HIGH (ref 70–99)
Glucose-Capillary: 125 mg/dL — ABNORMAL HIGH (ref 70–99)
Glucose-Capillary: 218 mg/dL — ABNORMAL HIGH (ref 70–99)

## 2019-10-07 MED ORDER — IPRATROPIUM-ALBUTEROL 0.5-2.5 (3) MG/3ML IN SOLN: 3.0000 mL | Freq: Three times a day (TID) | RESPIRATORY_TRACT | 0 refills | Status: DC

## 2019-10-07 MED ORDER — GUAIFENESIN ER 600 MG PO TB12: 1200.0000 mg | ORAL_TABLET | Freq: Two times a day (BID) | ORAL | 0 refills | Status: AC

## 2019-10-07 MED ORDER — DOXYCYCLINE HYCLATE 100 MG PO CAPS
100.0000 mg | ORAL_CAPSULE | Freq: Two times a day (BID) | ORAL | 0 refills | Status: AC
Start: 2019-10-07 — End: 2019-10-12

## 2019-10-07 MED ORDER — DEXTROMETHORPHAN POLISTIREX ER 30 MG/5ML PO SUER: 30.0000 mg | Freq: Two times a day (BID) | ORAL | 0 refills | Status: DC | PRN

## 2019-10-07 MED ORDER — ALBUTEROL SULFATE HFA 108 (90 BASE) MCG/ACT IN AERS
2.0000 | INHALATION_SPRAY | RESPIRATORY_TRACT | 2 refills | Status: DC | PRN
Start: 2019-10-07 — End: 2021-03-14

## 2019-10-07 NOTE — TOC Transition Note (Signed)
Transition of Care [facility]) - CM/SW Discharge Note   Patient Details  Name: Ryoma Nofziger MRN: [id number] Date of Birth: Feb 20, 1941  [facility] CM/SW Contact:  Barry Brunner, LCSW Phone Number: 10/07/2019, 12:09 PM   Clinical Narrative:    CSW contacted [employer] to provide patient with a nebulizer and 4 L flow for O2. [employer] agreeable to provide patient with equipment. TOC signing off.            Readmission Risk Interventions No flowsheet data found.

## 2019-10-07 NOTE — Discharge Summary (Signed)
Physician Discharge Summary  Dominic [name]:[id number] DOB: 22-Dec-1940 DOA: 10/05/2019  PCP: Alinda Deem, MD  Admit date: 10/05/2019 Discharge date: 10/07/2019  Admitted From:  Home Disposition:  Home   Recommendations for Outpatient Follow-up:  1. Follow up with PCP in 1 weeks 2. Follow up with pulmonologist as scheduled  Discharge Condition: STABLE   CODE STATUS: FULL    Brief Hospitalization Summary: Please see all hospital notes, images, labs for full details of the hospitalization. ADMISSION HPI: Dominic [name] is a 79 y.o. male with medical history significant for COPD (emphysema), type 2 diabetes mellitus, asbestos exposure who presented to ED from home with 4 days of dry hacking cough, chest congestion, shortness of breath, pleuritic chest wall pain, low grade fever and malaise.  Pt reports that he has had both covid vaccines and is fully vaccinated for covid 19.  He reports that his exercise tolerance is diminished and he feels overall much weaker over last several days.    ED Course: pt was tachypneic and hypoxic with pulse ox of 88%, placed on 2L [place] with improvement, temperature 99.7, respirations 23, blood pressure 112/66 pulse ox improved to 95% on 2 L nasal cannula, sodium 1, potassium 4.1, glucose 153, creatinine 1.22, total bilirubin 1.6, BNP 274.0, troponin XX 6, lactic acid 2.4, WBC 13.1, hemoglobin 11.6, platelet count 211.  SARS 2 COVID-19 test negative, portable chest x-ray with patchy left lower lobe pneumonia findings.  Blood cultures x2 taken, IV antibiotics started and admission requested.  1. Acute respiratory failure with hypoxia secondary to community-acquired pneumonia-RESOLVED. With patient's advanced age and comorbidities and high mortality risk he was admitted inpatient for IV antibiotics and supportive care. He was treated with Rocephin and azithromycin. he is clinically improved.  Anticipate DC home 7/24.  Blood cultures. No growth to date.  He will  qualify for home oxygen and he will be sent home on oxygen as arranged by [facility] service.  He does well on 4L [place].  He has outpatient appt with Dr. [name] pulmonologist in August 2021.  2. Poorly controlled Type 2 diabetes mellitus-as evidenced by hemoglobin A1c 7.3%, treated with supplemental sliding scale insulin and CBG monitoring.  Resume home treatment plan.  3. COPD-duo nebs ordered TID, continue, mucolytics and respiratory care.  Arrange for home nebulizer supplies and duonebs.  4. Sepsis secondary to pneumonia -RESOLVED. Patient presented with tachypnea, hypotension, community-acquired pneumonia and high lactic acid levels. He was treated with the sepsis protocol and he is much improved. 5. Lactic acidosis-treated and resolved.  6. Hypotension - BP improved after IV fluid bolus.  DVT prophylaxis:Lovenox Code Status:Full  Family Communication:grandson at bedside updated Disposition Plan:Home Consults called:N/A Admission status:INP   Discharge Diagnoses:  Principal Problem:   CAP (community acquired pneumonia) Active Problems:   Diabetes mellitus without complication (HCC)   Emphysema lung (HCC)   Acute respiratory failure with hypoxia (HCC)   Asbestos exposure   Sepsis due to pneumonia (HCC)   Lactic acidosis   Hyponatremia   Hypotension   Discharge Instructions:  Allergies as of 10/07/2019   No Known Allergies     Medication List    TAKE these medications   albuterol 108 (90 Base) MCG/ACT inhaler Commonly known as: VENTOLIN HFA Inhale 2 puffs into the lungs every 4 (four) hours as needed for wheezing or shortness of breath (cough, shortness of breath or wheezing.).   atorvastatin 20 MG tablet Commonly known as: LIPITOR Take 20 mg by mouth daily.   dextromethorphan 30 MG/5ML  liquid Commonly known as: Delsym Take 5 mLs (30 mg total) by mouth 2 (two) times daily as needed for cough.   doxycycline 100 MG capsule Commonly known as: VIBRAMYCIN Take 1 capsule  (100 mg total) by mouth 2 (two) times daily for 5 days.   guaiFENesin 600 MG 12 hr tablet Commonly known as: MUCINEX Take 2 tablets (1,200 mg total) by mouth 2 (two) times daily for 5 days.   ipratropium-albuterol 0.5-2.5 (3) MG/3ML Soln Commonly known as: DUONEB Take 3 mLs by nebulization in the morning, at noon, and at bedtime.   lisinopril 2.5 MG tablet Commonly known as: ZESTRIL Take 2.5 mg by mouth daily.   metFORMIN 500 MG tablet Commonly known as: GLUCOPHAGE Take 500 mg by mouth 2 (two) times daily.   PARoxetine 20 MG tablet Commonly known as: PAXIL Take 20 mg by mouth daily.            Durable Medical Equipment  (From admission, onward)         Start     Ordered   10/07/19 1139  For home use only DME Nebulizer machine  Once       Question Answer Comment  Patient needs a nebulizer to treat with the following condition COPD (chronic obstructive pulmonary disease) (HCC)   Length of Need Lifetime      10/07/19 1139   10/07/19 1137  For home use only DME oxygen  Once       Question Answer Comment  Length of Need Lifetime   Mode or (Route) Nasal cannula   Liters per Minute 4   Frequency Continuous (stationary and portable oxygen unit needed)   Oxygen conserving device Yes   Oxygen delivery system Gas      10/07/19 1136          Follow-up Information    Alinda Deem, MD. Schedule an appointment as soon as possible for a visit in 1 week(s).   Specialty: Family Medicine Contact information: [address] [place] [address] [phone]              No Known Allergies Allergies as of 10/07/2019   No Known Allergies     Medication List    TAKE these medications   albuterol 108 (90 Base) MCG/ACT inhaler Commonly known as: VENTOLIN HFA Inhale 2 puffs into the lungs every 4 (four) hours as needed for wheezing or shortness of breath (cough, shortness of breath or wheezing.).   atorvastatin 20 MG tablet Commonly known as: LIPITOR Take 20 mg  by mouth daily.   dextromethorphan 30 MG/5ML liquid Commonly known as: Delsym Take 5 mLs (30 mg total) by mouth 2 (two) times daily as needed for cough.   doxycycline 100 MG capsule Commonly known as: VIBRAMYCIN Take 1 capsule (100 mg total) by mouth 2 (two) times daily for 5 days.   guaiFENesin 600 MG 12 hr tablet Commonly known as: MUCINEX Take 2 tablets (1,200 mg total) by mouth 2 (two) times daily for 5 days.   ipratropium-albuterol 0.5-2.5 (3) MG/3ML Soln Commonly known as: DUONEB Take 3 mLs by nebulization in the morning, at noon, and at bedtime.   lisinopril 2.5 MG tablet Commonly known as: ZESTRIL Take 2.5 mg by mouth daily.   metFORMIN 500 MG tablet Commonly known as: GLUCOPHAGE Take 500 mg by mouth 2 (two) times daily.   PARoxetine 20 MG tablet Commonly known as: PAXIL Take 20 mg by mouth daily.  Durable Medical Equipment  (From admission, onward)         Start     Ordered   10/07/19 1139  For home use only DME Nebulizer machine  Once       Question Answer Comment  Patient needs a nebulizer to treat with the following condition COPD (chronic obstructive pulmonary disease) (HCC)   Length of Need Lifetime      10/07/19 1139   10/07/19 1137  For home use only DME oxygen  Once       Question Answer Comment  Length of Need Lifetime   Mode or (Route) Nasal cannula   Liters per Minute 4   Frequency Continuous (stationary and portable oxygen unit needed)   Oxygen conserving device Yes   Oxygen delivery system Gas      10/07/19 1136          Procedures/Studies: DG Chest Portable 1 View  Result Date: 10/05/2019 CLINICAL DATA:  Shortness of breath and cough for 4 days. EXAM: PORTABLE CHEST 1 VIEW COMPARISON:  Chest CT 09/06/2019 FINDINGS: The cardiomediastinal silhouette is within normal limits. 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 Right-sided pleural thickening and small volume loculated pleural fluid are similar to the prior  CT. Right basilar parenchymal lung opacities likely correspond to scarring on the CT. There is patchy opacity in the retrocardiac left lower lobe which appears new from the prior CT. There is scarring in the left upper lobe. No pneumothorax or acute osseous abnormality is identified. IMPRESSION: 1. Patchy left lower lobe opacity concerning for pneumonia. 2. Bilateral lung scarring, calcified pleural plaques, and right-sided pleural thickening and small loculated pleural effusion. Electronically Signed   By: Sebastian Ache M.D.   On: 10/05/2019 08:28      Subjective: Pt says he feels much better.  The neb treatments help him a lot and he is requesting home nebulizer.  He has appt with Dr. [name] pulmonology clinic to establish care next month.   Discharge Exam: Vitals:   10/07/19 0507 10/07/19 0747  BP: 91/69   Pulse: 72   Resp: 18   Temp: 97.7 F (36.5 C)   SpO2: 98% 98%   Vitals:   10/06/19 2110 10/07/19 0145 10/07/19 0507 10/07/19 0747  BP: 104/68  91/69   Pulse: 80  72   Resp: 18  18   Temp: 98.8 F (37.1 C)  97.7 F (36.5 C)   TempSrc: Oral  Oral   SpO2: 98% 97% 98% 98%  Weight:      Height:       General: Pt is alert, awake, not in acute distress Cardiovascular: normal S1/S2 +, no rubs, no gallops Respiratory: good air movement, minor exp wheezing, no rhonchi Abdominal: Soft, NT, ND, bowel sounds + Extremities: no edema, no cyanosis   The results of significant diagnostics from this hospitalization (including imaging, microbiology, ancillary and laboratory) are listed below for reference.     Microbiology: Recent Results (from the past 240 hour(s))  Blood culture (routine x 2)     Status: None (Preliminary result)   Collection Time: 10/05/19  8:43 AM   Specimen: Right Antecubital; Blood  Result Value Ref Range Status   Specimen Description RIGHT ANTECUBITAL  Final   Special Requests   Final    BOTTLES DRAWN AEROBIC AND ANAEROBIC Blood Culture adequate volume   Culture    Final    NO GROWTH 2 DAYS Performed at [facility], [address], [place], [place] [address]  Report Status PENDING  Incomplete  Blood culture (routine x 2)     Status: None (Preliminary result)   Collection Time: 10/05/19  8:45 AM   Specimen: BLOOD LEFT WRIST  Result Value Ref Range Status   Specimen Description BLOOD LEFT WRIST  Final   Special Requests   Final    BOTTLES DRAWN AEROBIC AND ANAEROBIC Blood Culture results may not be optimal due to an excessive volume of blood received in culture bottles   Culture   Final    NO GROWTH 2 DAYS Performed at [facility], [address], [place], [place] [address]    Report Status PENDING  Incomplete  SARS Coronavirus 2 by RT PCR (hospital order, performed in [facility] hospital lab) Nasopharyngeal Nasopharyngeal Swab     Status: None   Collection Time: 10/05/19  9:04 AM   Specimen: Nasopharyngeal Swab  Result Value Ref Range Status   SARS Coronavirus 2 NEGATIVE NEGATIVE Final    Comment: (NOTE) SARS-CoV-2 target nucleic acids are NOT DETECTED.  The SARS-CoV-2 RNA is generally detectable in upper and lower respiratory specimens during the acute phase of infection. The lowest concentration of SARS-CoV-2 viral copies this assay can detect is 250 copies / mL. A negative result does not preclude SARS-CoV-2 infection and should not be used as the sole basis for treatment or other patient management decisions.  A negative result may occur with improper specimen collection / handling, submission of specimen other than nasopharyngeal swab, presence of viral mutation(s) within the areas targeted by this assay, and inadequate number of viral copies (<250 copies / mL). A negative result must be combined with clinical observations, patient history, and epidemiological information.  Fact Sheet for Patients:   BoilerBrush.com.cy  Fact Sheet for Healthcare Providers: https://pope.com/  This  test is not yet approved or  cleared by the [place] FDA and has been authorized for detection and/or diagnosis of SARS-CoV-2 by FDA under an Emergency Use Authorization (EUA).  This EUA will remain in effect (meaning this test can be used) for the duration of the COVID-19 declaration under Section 564(b)(1) of the Act, 21 U.S.C. section 360bbb-3(b)(1), unless the authorization is terminated or revoked sooner.  Performed at [facility], [address] [place], [place] [address]      Labs: BNP (last 3 results) Recent Labs    10/05/19 0902  BNP 274.0*   Basic Metabolic Panel: Recent Labs  Lab 10/05/19 0843 10/06/19 0427  NA 129* 137  K 4.1 4.3  CL 97* 105  CO2 19* 24  GLUCOSE 153* 115*  BUN 42* 22  CREATININE 1.22 0.76  CALCIUM 8.9 8.6*  MG  --  1.8   Liver Function Tests: Recent Labs  Lab 10/05/19 0843  AST 30  ALT 28  ALKPHOS 91  BILITOT 1.6*  PROT 7.2  ALBUMIN 3.1*   No results for input(s): LIPASE, AMYLASE in the last 168 hours. No results for input(s): AMMONIA in the last 168 hours. CBC: Recent Labs  Lab 10/05/19 0843 10/06/19 0427  WBC 13.1* 7.9  NEUTROABS 10.9* 6.0  HGB 11.6* 9.7*  HCT 35.1* 30.7*  MCV 87.5 90.6  PLT 211 191   Cardiac Enzymes: No results for input(s): CKTOTAL, CKMB, CKMBINDEX, TROPONINI in the last 168 hours. BNP: Invalid input(s): POCBNP CBG: Recent Labs  Lab 10/06/19 1723 10/06/19 2026 10/07/19 0422 10/07/19 0735 10/07/19 1114  GLUCAP 122* 214* 135* 125* 218*   D-Dimer No results for input(s): DDIMER in the last 72 hours.  Hgb A1c Recent Labs    10/05/19 0831  HGBA1C 7.3*   Lipid Profile No results for input(s): CHOL, HDL, LDLCALC, TRIG, CHOLHDL, LDLDIRECT in the last 72 hours. Thyroid function studies No results for input(s): TSH, T4TOTAL, T3FREE, THYROIDAB in the last 72 hours.  Invalid input(s): FREET3 Anemia work up No results for input(s): VITAMINB12, FOLATE, FERRITIN, TIBC, IRON, RETICCTPCT in  the last 72 hours. Urinalysis No results found for: COLORURINE, APPEARANCEUR, LABSPEC, PHURINE, GLUCOSEU, HGBUR, BILIRUBINUR, KETONESUR, PROTEINUR, UROBILINOGEN, NITRITE, LEUKOCYTESUR Sepsis Labs Invalid input(s): PROCALCITONIN,  WBC,  LACTICIDVEN Microbiology Recent Results (from the past 240 hour(s))  Blood culture (routine x 2)     Status: None (Preliminary result)   Collection Time: 10/05/19  8:43 AM   Specimen: Right Antecubital; Blood  Result Value Ref Range Status   Specimen Description RIGHT ANTECUBITAL  Final   Special Requests   Final    BOTTLES DRAWN AEROBIC AND ANAEROBIC Blood Culture adequate volume   Culture   Final    NO GROWTH 2 DAYS Performed at [facility], [address], [place], [place] [address]    Report Status PENDING  Incomplete  Blood culture (routine x 2)     Status: None (Preliminary result)   Collection Time: 10/05/19  8:45 AM   Specimen: BLOOD LEFT WRIST  Result Value Ref Range Status   Specimen Description BLOOD LEFT WRIST  Final   Special Requests   Final    BOTTLES DRAWN AEROBIC AND ANAEROBIC Blood Culture results may not be optimal due to an excessive volume of blood received in culture bottles   Culture   Final    NO GROWTH 2 DAYS Performed at [facility], [address] [place], [place] [address]    Report Status PENDING  Incomplete  SARS Coronavirus 2 by RT PCR (hospital order, performed in [facility] hospital lab) Nasopharyngeal Nasopharyngeal Swab     Status: None   Collection Time: 10/05/19  9:04 AM   Specimen: Nasopharyngeal Swab  Result Value Ref Range Status   SARS Coronavirus 2 NEGATIVE NEGATIVE Final    Comment: (NOTE) SARS-CoV-2 target nucleic acids are NOT DETECTED.  The SARS-CoV-2 RNA is generally detectable in upper and lower respiratory specimens during the acute phase of infection. The lowest concentration of SARS-CoV-2 viral copies this assay can detect is 250 copies / mL. A negative result does not preclude SARS-CoV-2  infection and should not be used as the sole basis for treatment or other patient management decisions.  A negative result may occur with improper specimen collection / handling, submission of specimen other than nasopharyngeal swab, presence of viral mutation(s) within the areas targeted by this assay, and inadequate number of viral copies (<250 copies / mL). A negative result must be combined with clinical observations, patient history, and epidemiological information.  Fact Sheet for Patients:   BoilerBrush.com.cy  Fact Sheet for Healthcare Providers: https://pope.com/  This test is not yet approved or  cleared by the [place] FDA and has been authorized for detection and/or diagnosis of SARS-CoV-2 by FDA under an Emergency Use Authorization (EUA).  This EUA will remain in effect (meaning this test can be used) for the duration of the COVID-19 declaration under Section 564(b)(1) of the Act, 21 U.S.C. section 360bbb-3(b)(1), unless the authorization is terminated or revoked sooner.  Performed at [facility], [address] [place], [place] [address]    Time coordinating discharge: 35 mins   SIGNED:  Standley Dakins, MD  [facility] 10/07/2019, 12:03  PM How to contact the [facility] Attending or Consulting provider 7A - 7P or covering provider during after hours 7P -7A, for this patient?  1. Check the care team in [facility] and look for a) attending/consulting [facility] provider listed and b) the [facility] team listed 2. Log into [email or web address] and use [place]'s universal password to access. If you do not have the password, please contact the hospital operator. 3. Locate the [facility] provider you are looking for under [facility] and page to a number that you can be directly reached. 4. If you still have difficulty reaching the provider, please page the [facility] (Director on Call) for the Hospitalists listed on amion for assistance.

## 2019-10-07 NOTE — Discharge Instructions (Signed)
Community-Acquired Pneumonia, Adult Pneumonia is an infection of the lungs. It causes swelling in the airways of the lungs. Mucus and fluid may also build up inside the airways. One type of pneumonia can happen while a person is in a hospital. A different type can happen when a person is not in a hospital (community-acquired pneumonia).  What are the causes?  This condition is caused by germs (viruses, bacteria, or fungi). Some types of germs can be passed from one person to another. This can happen when you breathe in droplets from the cough or sneeze of an infected person. What increases the risk? You are more likely to develop this condition if you:  Have a long-term (chronic) disease, such as: ? Chronic obstructive pulmonary disease (COPD). ? Asthma. ? Cystic fibrosis. ? Congestive heart failure. ? Diabetes. ? Kidney disease.  Have HIV.  Have sickle cell disease.  Have had your spleen removed.  Do not take good care of your teeth and mouth (poor dental hygiene).  Have a medical condition that increases the risk of breathing in droplets from your own mouth and nose.  Have a weakened body defense system (immune system).  Are a smoker.  Travel to areas where the germs that cause this illness are common.  Are around certain animals or the places they live. What are the signs or symptoms?  A dry cough.  A wet (productive) cough.  Fever.  Sweating.  Chest pain. This often happens when breathing deeply or coughing.  Fast breathing or trouble breathing.  Shortness of breath.  Shaking chills.  Feeling tired (fatigue).  Muscle aches. How is this treated? Treatment for this condition depends on many things. Most adults can be treated at home. In some cases, treatment must happen in a hospital. Treatment may include:  Medicines given by mouth or through an IV tube.  Being given extra oxygen.  Respiratory therapy. In rare cases, treatment for very bad pneumonia  may include:  Using a machine to help you breathe.  Having a procedure to remove fluid from around your lungs. Follow these instructions at home: Medicines  Take over-the-counter and prescription medicines only as told by your doctor. ? Only take cough medicine if you are losing sleep.  If you were prescribed an antibiotic medicine, take it as told by your doctor. Do not stop taking the antibiotic even if you start to feel better. General instructions   Sleep with your head and neck raised (elevated). You can do this by sleeping in a recliner or by putting a few pillows under your head.  Rest as needed. Get at least 8 hours of sleep each night.  Drink enough water to keep your pee (urine) pale yellow.  Eat a healthy diet that includes plenty of vegetables, fruits, whole grains, low-fat dairy products, and lean protein.  Do not use any products that contain nicotine or tobacco. These include cigarettes, e-cigarettes, and chewing tobacco. If you need help quitting, ask your doctor.  Keep all follow-up visits as told by your doctor. This is important. How is this prevented? A shot (vaccine) can help prevent pneumonia. Shots are often suggested for:  People older than 80 years of age.  People older than 80 years of age who: ? Are having cancer treatment. ? Have long-term (chronic) lung disease. ? Have problems with their body's defense system. You may also prevent pneumonia if you take these actions:  Get the flu (influenza) shot every year.  Go to the dentist as   often as told.  Wash your hands often. If you cannot use soap and water, use hand sanitizer. Contact a doctor if:  You have a fever.  You lose sleep because your cough medicine does not help. Get help right away if:  You are short of breath and it gets worse.  You have more chest pain.  Your sickness gets worse. This is very serious if: ? You are an older adult. ? Your body's defense system is weak.  You  cough up blood. Summary  Pneumonia is an infection of the lungs.  Most adults can be treated at home. Some will need treatment in a hospital.  Drink enough water to keep your pee pale yellow.  Get at least 8 hours of sleep each night. This information is not intended to replace advice given to you by your health care provider. Make sure you discuss any questions you have with your health care provider. Document Revised: 06/22/2018 Document Reviewed: 10/28/2017 Elsevier Patient Education  2020 Else[employer] Inc[employer]   IMPORTANT INFORMATION: PAY CLOSE ATTENTION   PHYSICIAN DISCHARGE INSTRUCTIONS  Follow with Primary care provider  Dominic Deem, MD  and other consultants as instructed by your Hospitalist Physician  SEEK MEDICAL CARE OR RETURN TO EMERGENCY ROOM IF SYMPTOMS COME BACK, WORSEN OR NEW PROBLEM DEVELOPS   Please note: You were cared for by a hospitalist during your hospital stay. Every effort will be made to forward records to your primary care provider.  You can request that your primary care provider send for your hospital records if they have not received them.  Once you are discharged, your primary care physician will handle any further medical issues. Please note that NO REFILLS for any discharge medications will be authorized once you are discharged, as it is imperative that you return to your primary care physician (or establish a relationship with a primary care physician if you do not have one) for your post hospital discharge needs so that they can reassess your need for medications and monitor your lab values.  Please get a complete blood count and chemistry panel checked by your Primary MD at your next visit, and again as instructed by your Primary MD.  Get Medicines reviewed and adjusted: Please take all your medications with you for your next visit with your Primary MD  Laboratory/radiological data: Please request your Primary MD to go over all hospital tests and  procedure/radiological results at the follow up, please ask your primary care provider to get all Hospital records sent to his/her office.  In some cases, they will be blood work, cultures and biopsy results pending at the time of your discharge. Please request that your primary care provider follow up on these results.  If you are diabetic, please bring your blood sugar readings with you to your follow up appointment with primary care.    Please call and make your follow up appointments as soon as possible.    Also Note the following: If you experience worsening of your admission symptoms, develop shortness of breath, life threatening emergency, suicidal or homicidal thoughts you must seek medical attention immediately by calling 911 or calling your MD immediately  if symptoms less severe.  You must read complete instructions/literature along with all the possible adverse reactions/side effects for all the Medicines you take and that have been prescribed to you. Take any new Medicines after you have completely understood and accpet all the possible adverse reactions/side effects.   Do not drive when taking Pain  medications or sleeping medications (Benzodiazepines)  Do not take more than prescribed Pain, Sleep and Anxiety Medications. It is not advisable to combine anxiety,sleep and pain medications without talking with your primary care practitioner  Special Instructions: If you have smoked or chewed Tobacco  in the last 2 yrs please stop smoking, stop any regular Alcohol  and or any Recreational drug use.  Wear Seat belts while driving.  Do not drive if taking any narcotic, mind altering or controlled substances or recreational drugs or alcohol.

## 2019-10-07 NOTE — Progress Notes (Signed)
Patient and family state understanding of discharge instructions, IV d/ced with site wnl.  Patient waiting for delivery of his home oxygen before leaving

## 2019-10-07 NOTE — Progress Notes (Signed)
SATURATION QUALIFICATIONS: (This note is used to comply with regulatory documentation for home oxygen)  Patient Saturations on Room Air at Rest = 88%  Patient Saturations on Room Air while Ambulating = 85%

## 2019-10-09 LAB — LEGIONELLA PNEUMOPHILA SEROGP 1 UR AG: L. pneumophila Serogp 1 Ur Ag: NEGATIVE

## 2019-10-10 LAB — CULTURE, BLOOD (ROUTINE X 2)
Culture: NO GROWTH
Culture: NO GROWTH
Special Requests: ADEQUATE

## 2019-11-10 ENCOUNTER — Institutional Professional Consult (permissible substitution): Payer: Medicare Other | Admitting: Internal Medicine

## 2019-11-10 ENCOUNTER — Encounter: Payer: Self-pay | Admitting: Internal Medicine

## 2019-11-10 ENCOUNTER — Other Ambulatory Visit: Payer: Self-pay

## 2019-11-10 ENCOUNTER — Ambulatory Visit: Payer: Medicare Other | Admitting: Internal Medicine

## 2019-11-10 VITALS — BP 120/64 | HR 88 | Temp 97.4°F | Ht 71.0 in | Wt 185.0 lb

## 2019-11-10 DIAGNOSIS — R0600 Dyspnea, unspecified: Secondary | ICD-10-CM | POA: Diagnosis not present

## 2019-11-10 DIAGNOSIS — I10 Essential (primary) hypertension: Secondary | ICD-10-CM

## 2019-11-10 DIAGNOSIS — J61 Pneumoconiosis due to asbestos and other mineral fibers: Secondary | ICD-10-CM

## 2019-11-10 DIAGNOSIS — Z77090 Contact with and (suspected) exposure to asbestos: Secondary | ICD-10-CM | POA: Diagnosis not present

## 2019-11-10 DIAGNOSIS — J449 Chronic obstructive pulmonary disease, unspecified: Secondary | ICD-10-CM

## 2019-11-10 DIAGNOSIS — R0609 Other forms of dyspnea: Secondary | ICD-10-CM

## 2019-11-10 MED ORDER — LOSARTAN POTASSIUM 25 MG PO TABS: 25.0000 mg | ORAL_TABLET | Freq: Every day | ORAL | 11 refills | Status: DC

## 2019-11-10 MED ORDER — LOSARTAN POTASSIUM 25 MG PO TABS
25.0000 mg | ORAL_TABLET | Freq: Every day | ORAL | 11 refills | Status: DC
Start: 2019-11-10 — End: 2023-07-31

## 2019-11-10 NOTE — Patient Instructions (Addendum)
Only use your albuterol as a rescue medication to be used if you can't catch your breath by resting or doing a relaxed purse lip breathing pattern.  - The less you use it, the better it will work when you need it. - Ok to use up to 2 puffs  every 4 hours if you must but call for immediate appointment if use goes up over your usual need - Don't leave home without it !!  (think of it like the spare tire for your car)   Also ok  Try albuterol 15 min before an activity that you know would make you short of breath and see if it makes any difference and if makes none then don't take it after activity unless you can't catch your breath.  Make sure you check your oxygen saturations at highest level of activity to be sure it stays over 90% and keep track of it at least once a week, more often if breathing getting worse, and let me know if losing ground.   Stop lisinopril and start losartan 25 mg one daily instead  We will set up for a PFT and high resolution CT chest in 3 months same day and see me as well.

## 2019-11-10 NOTE — Progress Notes (Signed)
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 Follow up with PCP in 1 weeks 2. Follow up with pulmonologist as scheduled  Discharge Condition: STABLE   CODE STATUS: FULL    Brief Hospitalization Summary: Please see all hospital notes, images, labs for full details of the hospitalization. ADMISSION GEX:BMWUXL Dominic Dominic [name] a 79 y.o.malewith medical history significantfor COPD (emphysema), type 2 diabetes mellitus, asbestos exposure who presented to ED from home with 4 days of dry hacking cough, chest congestion, shortness of breath, pleuritic chest wall pain, low grade fever and malaise. Pt reports that he has had both covid vaccines and is fully vaccinated for covid 19. He reports that his exercise tolerance is diminished and he feels overall much weaker over last several days.   ED Course:pt was tachypneic and hypoxic with pulse ox of 88%, placed on 2L [place] with improvement,temperature 99.7, respirations 23, blood pressure 112/66 pulse ox improved to 95% on 2 L nasal cannula, sodium 1, potassium 4.1, glucose 153, creatinine 1.22, total bilirubin 1.6, BNP 274.0, troponin XX 6, lactic acid 2.4, WBC 13.1, hemoglobin 11.6, platelet count 211. SARS 2 COVID-19 test negative, portable chest x-ray with patchy left lower lobe pneumonia findings. Blood cultures x2 taken, IV antibiotics started and admission requested.  1. Acute respiratory failure with  hypoxia secondary to community-acquired pneumonia-RESOLVED. With patient's advanced age and comorbidities and high mortality risk he was admitted inpatient for IV antibiotics and supportive care. He was treated with Rocephin and azithromycin.he is clinically improved. Anticipate DC home 7/24. Blood cultures. No growth to date. He will qualify for home oxygen and he will be sent home on oxygen as arranged by [facility] service.  He does well on 4L [place].  He has outpatient appt with Dr. [name] pulmonologist in August 2021.  2. Poorly controlledType 2 diabetes mellitus-as evidenced byhemoglobin A1c 7.3%,treated withsupplemental sliding scale insulin and CBG monitoring.  Resume home treatment plan.  3. COPD-duo nebs ordered TID, continue, mucolytics and respiratory care.  Arrange for home nebulizer supplies and duonebs.  4. Sepsis secondary to pneumonia -RESOLVED. Patient presented with tachypnea, hypotension, community-acquired pneumonia and high lactic acid levels. Hewastreated with the sepsis protocol and he is much improved. 5. Lactic acidosis-treated and resolved. 6. Hypotension - BP improved after IV fluid bolus.  DVT prophylaxis:Lovenox Code Status:Full Family Communication:grandson at bedside updated Disposition Plan:Home Consults called:N/A Admission status:INP   Discharge Diagnoses:  Principal Problem:   CAP (community acquired pneumonia) Active Problems:   Diabetes mellitus without complication (HCC)   Emphysema lung (HCC)   Acute respiratory failure with hypoxia (HCC)   Asbestos exposure   Sepsis due to pneumonia (HCC)   Lactic acidosis   Hyponatremia   Hypotension   Discharge Instructions: Allergies as of 10/07/2019   No Known Allergies              Medication List        TAKE these medications  albuterol 108 (90 Base) MCG/ACT inhaler Commonly known as: VENTOLIN HFA Inhale 2 puffs into the lungs every 4 (four) hours as needed for wheezing  or shortness of breath (cough, shortness of breath or wheezing.).   atorvastatin 20 MG tablet Commonly known as: LIPITOR Take 20 mg by mouth daily.   dextromethorphan 30 MG/5ML liquid Commonly known as: Delsym Take 5 mLs (30 mg total) by mouth 2 (two) times daily as needed for cough.   doxycycline 100 MG capsule Commonly known as: VIBRAMYCIN Take 1 capsule (100 mg total) by mouth 2 (two) times daily for 5 days.   guaiFENesin 600 MG 12 hr tablet Commonly known as: MUCINEX Take 2 tablets (1,200 mg total) by mouth 2 (two) times daily for 5 days.   ipratropium-albuterol 0.5-2.5 (3) MG/3ML Soln Commonly known as: DUONEB Take 3 mLs by nebulization in the morning, at noon, and at bedtime.   lisinopril 2.5 MG tablet Commonly known as: ZESTRIL Take 2.5 mg by mouth daily.   metFORMIN 500 MG tablet Commonly known as: GLUCOPHAGE Take 500 mg by mouth 2 (two) times daily.   PARoxetine 20 MG tablet Commonly known as: PAXIL Take 20 mg by mouth daily.        History of Present Illness  11/10/2019  Pulmonary/ 1st Dominic [name] eval/ Dominic Dominic [name] / Dominic Dominic [name] asbestosis Chief Complaint  Patient presents with  . Consult    Shortness of breath with exertion. Fine when resting. Left side of head stays stopped up and drains and makes him have dry cough. Worked in [employer] since 11th grade and was around [employer].  Dyspnea:  Can't do a whole [employer] / one flight of steps is difficult / mb is 200 ft flat s stopping with sats never lower than 93%  Cough: hoarse, sensation of throat tickles x one year  On acei no mucus prod Sleep: nose stopped up / on back bed is flat/ one pillow  SABA use:  Albuterol tid not prn - not sure it helps   No obvious day to day or daytime variability or assoc excess/ purulent sputum or mucus plugs or hemoptysis or cp or chest tightness, subjective wheeze or overt   hb symptoms.    Also denies any obvious fluctuation of symptoms with weather or environmental  changes or other aggravating or alleviating factors except as outlined above   No unusual exposure hx or h/o childhood pna/ asthma or knowledge of premature birth.  Current Allergies, Complete Past Medical History, Past Surgical History, Family History, and Social History were [facility] electronic [facility] record.  ROS  The following are not active complaints unless bolded Hoarseness, sore throat, dysphagia, dental problems, itching, sneezing,  nasal congestion or discharge of excess mucus or purulent secretions, ear ache,   fever, chills, sweats, unintended wt loss or wt gain, classically pleuritic or exertional cp,  orthopnea pnd or arm/hand swelling  or leg swelling, presyncope, palpitations, abdominal pain, anorexia, nausea, vomiting, diarrhea  or change in bowel habits or change in bladder habits, change in stools or change in urine, dysuria, hematuria,  rash, arthralgias, visual complaints, headache, numbness, weakness or ataxia or problems with walking or coordination,  change in mood or  memory.             Past Medical History:  Diagnosis Date  . Asbestos exposure   . Diabetes mellitus without complication (HCC)   . Emphysema lung (HCC)     Outpatient Medications Prior to Visit  Medication Sig Dispense  Refill  . albuterol (VENTOLIN HFA) 108 (90 Base) MCG/ACT inhaler Inhale 2 puffs into the lungs every 4 (four) hours as needed for wheezing or shortness of breath (cough, shortness of breath or wheezing.). 8 g 2  . atorvastatin (LIPITOR) 20 MG tablet Take 20 mg by mouth daily.    Marland Kitchen lisinopril (ZESTRIL) 2.5 MG tablet Take 2.5 mg by mouth daily.    . metFORMIN (GLUCOPHAGE) 500 MG tablet Take 500 mg by mouth 2 (two) times daily.    Marland Kitchen PARoxetine (PAXIL) 20 MG tablet Take 20 mg by mouth daily.    .    360 mL 0  . dextromethorphan (DELSYM) 30 MG/5ML liquid Take 5 mLs (30 mg total) by mouth 2 (two) times daily as needed for cough. 89 mL 0      Objective:     BP  120/64 (BP Location: Left Arm, Patient Position: Sitting, Cuff Size: Normal)   Pulse 88   Temp (!) 97.4 F (36.3 C) (Temporal)   Ht  (1.803 m)   Wt 185 lb (83.9 kg)   SpO2 95%   BMI 25.80 kg/m   SpO2: 95 %   Pleasant amb hoarse  wm nad   HEENT : pt wearing mask not removed for exam due to covid -19 concerns.    NECK :  without JVD/Nodes/TM/ nl carotid upstrokes bilaterally   LUNGS: no acc muscle use,  slt barrel contour chest with insp crackles bases /pops and squeaks  bilaterally without cough on insp or exp maneuvers   CV:  RRR  no s3 or murmur or increase in P2, and no edema   ABD:  soft and nontender with nl inspiratory excursion in the supine position. No bruits or organomegaly appreciated, bowel sounds nl  MS:  Nl gait/ ext warm without deformities, calf tenderness, cyanosis  - Mild  Clubbing - No obvious joint restrictions   SKIN: warm and dry without lesions    NEURO:  alert, approp, nl sensorium with  no motor or cerebellar deficits apparent.    I personally reviewed images and agree with radiology impression as follows:   Chest CT 09/06/19  1. Stable appearance of scattered bilateral noncalcified pulmonary nodules, largest within the left lower lobe measuring up to 9 mm. No new or enlarging pulmonary nodule. Per previously listed recommendations, next follow-up noncontrast chest CT is recommended in 14-20 months from today's scan for high risk patients. 2. Bilateral calcified pleural plaques with trace loculated right pleural effusion and extensive right-sided pleuroparenchymal scarring compatible with asbestos-related pleural disease, unchanged. 3. Aortic Atherosclerosis (ICD10-I70.0) and Emphysema (ICD10-J43.9).      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 1960s  - extensive R > L pleural dz CT chest  2/11 21 and unchanged 09/06/19   Classic hx and exam and suspect we'll see  significant ILD to accompany the pleural dz on HRCT but nothing to offer other than suggest he pursue benefits and uses 02 if sats start dropping > 90%  F/u ct will also screen for mesolthelioma and lung ca for which he he at increase risk.  Discussed in detail all the  indications, usual  risks and alternatives  relative to the benefits with patient who agrees to proceed with w/u as outlined.        COPD GOLD ?  Quit smoking 1980  - Emphysema on CT chest 04/27/19 - PFT's ordered    When respiratory  symptoms begin or become refractory well after a patient reports complete smoking cessation,  Especially when this wasn't the case while they were smoking, a red flag is raised based on the work of Dr Primitivo Gauze which states:  if you quit smoking when your best day FEV1 is still well preserved it is highly unlikely you will progress to severe disease.  That is to say, once the smoking stops,  the symptoms should not suddenly(one year prior to OV ) erupt or markedly worsen.  If so, the differential diagnosis should include  obesity/deconditioning,  LPR/Reflux/Aspiration syndromes,  occult CHF, or  especially side effect of medications commonly used in this population, with  ACEi adverse effects at the  top of the usual list of suspects and the only way to rule it out is a trial off > see a/p     Re saba: I spent extra time with pt today reviewing appropriate use of albuterol for prn use on exertion with the following points: 1) saba is for relief of sob that does not improve by walking a slower pace or resting but rather if the pt does not improve after trying this first. 2) If the pt is convinced, as many are, that saba helps recover from activity faster then it's easy to tell if this is the case by re-challenging : ie stop, take the inhaler, then p 5 minutes try the exact same activity (intensity of workload) that just caused the symptoms and see if they are substantially diminished or not after  saba 3) if there is an activity that reproducibly causes the symptoms, try the saba 15 min before the activity on alternate days   If in fact the saba really does help, then fine to continue to use it prn but advised may need to look closer at the maintenance regimen being used to achieve better control of airways disease with exertion.       Essential hypertension Try off acei 11/10/2019 due to hoarseness/ cough   In the best review of chronic cough to date ( NEJM 2016 375 [id number]) ,  ACEi are now felt to cause cough in up to  20% of pts which is a 4 fold increase from previous reports and does not include the variety of non-specific complaints we see in pulmonary clinic in pts on ACEi but previously attributed to another dx like  Copd/asthma and  include PNDS, throat tickle/ "congestion", "bronchitis", unexplained dyspnea and noct "strangling" sensations, and hoarseness, but also  atypical /refractory GERD symptoms like dysphagia and "bad heartburn"   The only way I know  to prove this is not an "ACEi Case" is a trial off ACEi x a minimum of 6 weeks then regroup.   >>> Try losartan 25 mg daily in place of lisinopril 2.5 mg daily since bp on low side and probably using ACEi here for renal protection anyway and not necessarily for  HBP   Medical decision making was a high  level of complexity in this case because of  > two chronic conditions /diagnoses requiring extra time for  H and P, chart review, counseling, and generating customized AVS unique to this Dominic [name] visit and charting/ total time = 70 min    Each maintenance medication was reviewed in detail including emphasizing most importantly the difference between maintenance and prns and under what circumstances the prns are to be triggered using an action plan format where appropriate. Please see avs for details which were reviewed in writing by  both me and my nurse and patient given a written copy highlighted where appropriate with yellow  highlighter for the patient's continued care at home along with an updated version of their medications.  Patient was asked to maintain medication reconciliation by comparing this list to the actual medications being used at home and to contact this Dominic [name] right away if there is a conflict or discrepancy.      Dominic Hughs, MD 11/10/2019

## 2019-11-11 ENCOUNTER — Encounter: Payer: Self-pay | Admitting: Internal Medicine

## 2019-11-11 DIAGNOSIS — I10 Essential (primary) hypertension: Secondary | ICD-10-CM | POA: Insufficient documentation

## 2019-11-11 NOTE — Assessment & Plan Note (Signed)
Quit smoking 1980  - Emphysema on CT chest 04/27/19 - PFT's ordered    When respiratory symptoms begin or become refractory well after a patient reports complete smoking cessation,  Especially when this wasn't the case while they were smoking, a red flag is raised based on the work of Dr Primitivo Gauze which states:  if you quit smoking when your best day FEV1 is still well preserved it is highly unlikely you will progress to severe disease.  That is to say, once the smoking stops,  the symptoms should not suddenly(one year prior to OV ) erupt or markedly worsen.  If so, the differential diagnosis should include  obesity/deconditioning,  LPR/Reflux/Aspiration syndromes,  occult CHF, or  especially side effect of medications commonly used in this population, with  ACEi adverse effects at the  top of the usual list of suspects and the only way to rule it out is a trial off > see a/p     Re saba: I spent extra time with pt today reviewing appropriate use of albuterol for prn use on exertion with the following points: 1) saba is for relief of sob that does not improve by walking a slower pace or resting but rather if the pt does not improve after trying this first. 2) If the pt is convinced, as many are, that saba helps recover from activity faster then it's easy to tell if this is the case by re-challenging : ie stop, take the inhaler, then p 5 minutes try the exact same activity (intensity of workload) that just caused the symptoms and see if they are substantially diminished or not after saba 3) if there is an activity that reproducibly causes the symptoms, try the saba 15 min before the activity on alternate days   If in fact the saba really does help, then fine to continue to use it prn but advised may need to look closer at the maintenance regimen being used to achieve better control of airways disease with exertion.

## 2019-11-11 NOTE — Assessment & Plan Note (Addendum)
Try off acei 11/10/2019 due to hoarseness/ cough   In the best review of chronic cough to date ( NEJM 2016 375 [id number]) ,  ACEi are now felt to cause cough in up to  20% of pts which is a 4 fold increase from previous reports and does not include the variety of non-specific complaints we see in pulmonary clinic in pts on ACEi but previously attributed to another dx like  Copd/asthma and  include PNDS, throat tickle/ "congestion", "bronchitis", unexplained dyspnea and noct "strangling" sensations, and hoarseness, but also  atypical /refractory GERD symptoms like dysphagia and "bad heartburn"   The only way I know  to prove this is not an "ACEi Case" is a trial off ACEi x a minimum of 6 weeks then regroup.   >>> Try losartan 25 mg daily in place of lisinopril 2.5 mg daily since bp on low side and probably using ACEi here for renal protection anyway and not necessarily for  HBP   Medical decision making was a high  level of complexity in this case because of  two chronic conditions /diagnoses requiring extra time for  H and P, chart review, counseling, and generating customized AVS unique to this office visit and charting/ total time = 70 min    Each maintenance medication was reviewed in detail including emphasizing most importantly the difference between maintenance and prns and under what circumstances the prns are to be triggered using an action plan format where appropriate. Please see avs for details which were reviewed in writing by both me and my nurse and patient given a written copy highlighted where appropriate with yellow highlighter for the patient's continued care at home along with an updated version of their medications.  Patient was asked to maintain medication reconciliation by comparing this list to the actual medications being used at home and to contact this office right away if there is a conflict or discrepancy.

## 2019-11-11 NOTE — Assessment & Plan Note (Signed)
Long h/o exposure hx including working for [employer] in [place] in the ? 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  Discussed in detail all the  indications, usual  risks and alternatives  relative to the benefits with patient who agrees to proceed with w/u as outlined.

## 2020-02-02 ENCOUNTER — Other Ambulatory Visit: Payer: Self-pay

## 2020-02-02 ENCOUNTER — Other Ambulatory Visit (HOSPITAL_COMMUNITY)
Admission: RE | Admit: 2020-02-02 | Discharge: 2020-02-02 | Disposition: A | Discharge status: Home / Self Care | Payer: Medicare Other | Source: Ambulatory Visit | Attending: Internal Medicine | Admitting: Internal Medicine

## 2020-02-02 DIAGNOSIS — Z01812 Encounter for preprocedural laboratory examination: Secondary | ICD-10-CM | POA: Insufficient documentation

## 2020-02-02 DIAGNOSIS — Z20822 Contact with and (suspected) exposure to covid-19: Secondary | ICD-10-CM | POA: Insufficient documentation

## 2020-02-02 LAB — SARS CORONAVIRUS 2 (TAT 6-24 HRS): SARS Coronavirus 2: NEGATIVE

## 2020-02-05 ENCOUNTER — Ambulatory Visit (HOSPITAL_COMMUNITY)
Admission: RE | Admit: 2020-02-05 | Discharge: 2020-02-05 | Disposition: A | Discharge status: Home / Self Care | Payer: Medicare Other | Source: Ambulatory Visit | Attending: Internal Medicine | Admitting: Internal Medicine

## 2020-02-05 ENCOUNTER — Other Ambulatory Visit: Payer: Self-pay

## 2020-02-05 DIAGNOSIS — R0609 Other forms of dyspnea: Secondary | ICD-10-CM

## 2020-02-05 DIAGNOSIS — R0600 Dyspnea, unspecified: Secondary | ICD-10-CM | POA: Insufficient documentation

## 2020-02-05 DIAGNOSIS — Z77090 Contact with and (suspected) exposure to asbestos: Secondary | ICD-10-CM | POA: Diagnosis not present

## 2020-02-06 ENCOUNTER — Ambulatory Visit (HOSPITAL_COMMUNITY)
Admission: RE | Admit: 2020-02-06 | Discharge: 2020-02-06 | Disposition: A | Discharge status: Home / Self Care | Payer: Medicare Other | Source: Ambulatory Visit | Attending: Internal Medicine | Admitting: Internal Medicine

## 2020-02-06 ENCOUNTER — Other Ambulatory Visit: Payer: Self-pay

## 2020-02-06 DIAGNOSIS — Z77090 Contact with and (suspected) exposure to asbestos: Secondary | ICD-10-CM | POA: Insufficient documentation

## 2020-02-06 DIAGNOSIS — R0609 Other forms of dyspnea: Secondary | ICD-10-CM

## 2020-02-06 DIAGNOSIS — R0600 Dyspnea, unspecified: Secondary | ICD-10-CM | POA: Diagnosis present

## 2020-02-06 LAB — PULMONARY FUNCTION TEST
DL/VA % pred: 95 %
DL/VA: 3.7 ml/min/mmHg/L
DLCO unc % pred: 60 %
DLCO unc: 15.17 ml/min/mmHg
FEF 25-75 Post: 1.11 L/s
FEF 25-75 Pre: 1.2 L/s
FEF2575-%Change-Post: -7 %
FEF2575-%Pred-Post: 53 %
FEF2575-%Pred-Pre: 57 %
FEV1-%Change-Post: 4 %
FEV1-%Pred-Post: 57 %
FEV1-%Pred-Pre: 55 %
FEV1-Post: 1.73 L
FEV1-Pre: 1.65 L
FEV1FVC-%Change-Post: -7 %
FEV1FVC-%Pred-Pre: 99 %
FEV6-%Change-Post: 16 %
FEV6-%Pred-Post: 66 %
FEV6-%Pred-Pre: 57 %
FEV6-Post: 2.62 L
FEV6-Pre: 2.25 L
FEV6FVC-%Pred-Post: 106 %
FEV6FVC-%Pred-Pre: 106 %
FVC-%Change-Post: 12 %
FVC-%Pred-Post: 62 %
FVC-%Pred-Pre: 55 %
FVC-Post: 2.62 L
FVC-Pre: 2.33 L
Post FEV1/FVC ratio: 66 %
Post FEV6/FVC ratio: 100 %
Pre FEV1/FVC ratio: 71 %
Pre FEV6/FVC Ratio: 100 %
RV % pred: 132 %
RV: 3.59 L
TLC % pred: 83 %
TLC: 6.05 L

## 2020-02-06 MED ORDER — ALBUTEROL SULFATE (2.5 MG/3ML) 0.083% IN NEBU
2.5000 mg | INHALATION_SOLUTION | Freq: Once | RESPIRATORY_TRACT | Status: AC
  Administered 2020-02-06: 2.5 mg via RESPIRATORY_TRACT

## 2020-02-07 NOTE — Progress Notes (Signed)
Spoke with pt and notified of results per Dr. Wert. Pt verbalized understanding and denied any questions. 

## 2020-02-15 ENCOUNTER — Encounter: Payer: Self-pay | Admitting: Internal Medicine

## 2020-02-15 ENCOUNTER — Other Ambulatory Visit: Payer: Self-pay

## 2020-02-15 ENCOUNTER — Other Ambulatory Visit (HOSPITAL_COMMUNITY)
Admission: RE | Admit: 2020-02-15 | Discharge: 2020-02-15 | Disposition: A | Discharge status: Home / Self Care | Payer: Medicare Other | Source: Ambulatory Visit | Attending: Internal Medicine | Admitting: Internal Medicine

## 2020-02-15 ENCOUNTER — Ambulatory Visit: Payer: Medicare Other | Admitting: Internal Medicine

## 2020-02-15 DIAGNOSIS — R059 Cough, unspecified: Secondary | ICD-10-CM | POA: Insufficient documentation

## 2020-02-15 DIAGNOSIS — J329 Chronic sinusitis, unspecified: Secondary | ICD-10-CM | POA: Insufficient documentation

## 2020-02-15 DIAGNOSIS — R0600 Dyspnea, unspecified: Secondary | ICD-10-CM | POA: Insufficient documentation

## 2020-02-15 DIAGNOSIS — R0609 Other forms of dyspnea: Secondary | ICD-10-CM | POA: Insufficient documentation

## 2020-02-15 DIAGNOSIS — J449 Chronic obstructive pulmonary disease, unspecified: Secondary | ICD-10-CM | POA: Diagnosis not present

## 2020-02-15 DIAGNOSIS — J61 Pneumoconiosis due to asbestos and other mineral fibers: Secondary | ICD-10-CM

## 2020-02-15 LAB — CBC WITH DIFFERENTIAL/PLATELET
Abs Immature Granulocytes: 0.02 10*3/uL (ref 0.00–0.07)
Basophils Absolute: 0 10*3/uL (ref 0.0–0.1)
Basophils Relative: 1 %
Eosinophils Absolute: 0.5 10*3/uL (ref 0.0–0.5)
Eosinophils Relative: 9 %
HCT: 38.9 % — ABNORMAL LOW (ref 39.0–52.0)
Hemoglobin: 12.1 g/dL — ABNORMAL LOW (ref 13.0–17.0)
Immature Granulocytes: 0 %
Lymphocytes Relative: 18 %
Lymphs Abs: 1.1 10*3/uL (ref 0.7–4.0)
MCH: 28.4 pg (ref 26.0–34.0)
MCHC: 31.1 g/dL (ref 30.0–36.0)
MCV: 91.3 fL (ref 80.0–100.0)
Monocytes Absolute: 0.5 10*3/uL (ref 0.1–1.0)
Monocytes Relative: 9 %
Neutro Abs: 3.7 10*3/uL (ref 1.7–7.7)
Neutrophils Relative %: 63 %
Platelets: 240 10*3/uL (ref 150–400)
RBC: 4.26 MIL/uL (ref 4.22–5.81)
RDW: 13.7 % (ref 11.5–15.5)
WBC: 5.9 10*3/uL (ref 4.0–10.5)
nRBC: 0 % (ref 0.0–0.2)

## 2020-02-15 LAB — TSH: TSH: 1.219 u[IU]/mL (ref 0.350–4.500)

## 2020-02-15 LAB — D-DIMER, QUANTITATIVE: D-Dimer, Quant: 1.03 ug/mL-FEU — ABNORMAL HIGH (ref 0.00–0.50)

## 2020-02-15 LAB — BRAIN NATRIURETIC PEPTIDE: B Natriuretic Peptide: 45 pg/mL (ref 0.0–100.0)

## 2020-02-15 LAB — SEDIMENTATION RATE: Sed Rate: 67 mm/hr — ABNORMAL HIGH (ref 0–16)

## 2020-02-15 NOTE — Progress Notes (Signed)
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 Follow up with PCP in 1 weeks 2. Follow up with pulmonologist as scheduled  Discharge Condition: STABLE   CODE STATUS: FULL    Brief Hospitalization Summary: Please see all hospital notes, images, labs for full details of the hospitalization. ADMISSION GEX:BMWUXL [name] a 79 y.o.malewith medical history significantfor COPD (emphysema), type 2 diabetes mellitus, asbestos exposure who presented to ED from home with 4 days of dry hacking cough, chest congestion, shortness of breath, pleuritic chest wall pain, low grade fever and malaise. Pt reports that he has had both covid vaccines and is fully vaccinated for covid 19. He reports that his exercise tolerance is diminished and he feels overall much weaker over last several days.   ED Course:pt was tachypneic and hypoxic with pulse ox of 88%, placed on 2L [place] with improvement,temperature 99.7, respirations 23, blood pressure 112/66 pulse ox improved to 95% on 2 L nasal cannula, sodium 1, potassium 4.1, glucose 153, creatinine 1.22, total bilirubin 1.6, BNP 274.0, troponin XX 6, lactic acid 2.4, WBC 13.1, hemoglobin 11.6, platelet count 211. SARS 2 COVID-19 test negative, portable chest x-ray with patchy left lower lobe pneumonia findings. Blood cultures x2 taken, IV antibiotics started and admission requested.  1. Acute respiratory failure with  hypoxia secondary to community-acquired pneumonia-RESOLVED. With patient's advanced age and comorbidities and high mortality risk he was admitted inpatient for IV antibiotics and supportive care. He was treated with Rocephin and azithromycin.he is clinically improved. Anticipate DC home 7/24. Blood cultures. No growth to date. He will qualify for home oxygen and he will be sent home on oxygen as arranged by [facility] service.  He does well on 4L [place].  He has outpatient appt with Dr. [name] pulmonologist in August 2021.  2. Poorly controlledType 2 diabetes mellitus-as evidenced byhemoglobin A1c 7.3%,treated withsupplemental sliding scale insulin and CBG monitoring.  Resume home treatment plan.  3. COPD-duo nebs ordered TID, continue, mucolytics and respiratory care.  Arrange for home nebulizer supplies and duonebs.  4. Sepsis secondary to pneumonia -RESOLVED. Patient presented with tachypnea, hypotension, community-acquired pneumonia and high lactic acid levels. Hewastreated with the sepsis protocol and he is much improved. 5. Lactic acidosis-treated and resolved. 6. Hypotension - BP improved after IV fluid bolus.  DVT prophylaxis:Lovenox Code Status:Full Family Communication:grandson at bedside updated Disposition Plan:Home Consults called:N/A Admission status:INP   Discharge Diagnoses:  Principal Problem:   CAP (community acquired pneumonia) Active Problems:   Diabetes mellitus without complication (HCC)   Emphysema lung (HCC)   Acute respiratory failure with hypoxia (HCC)   Asbestos exposure   Sepsis due to pneumonia (HCC)   Lactic acidosis   Hyponatremia   Hypotension   Discharge Instructions: Allergies as of 10/07/2019   No Known Allergies              Medication List        TAKE these medications  albuterol 108 (90 Base) MCG/ACT inhaler Commonly known as: VENTOLIN HFA Inhale 2 puffs into the lungs every 4 (four) hours as needed for wheezing  or shortness of breath (cough, shortness of breath or wheezing.).   atorvastatin 20 MG tablet Commonly known as: LIPITOR Take 20 mg by mouth daily.   dextromethorphan 30 MG/5ML liquid Commonly known as: Delsym Take 5 mLs (30 mg total) by mouth 2 (two) times daily as needed for cough.   doxycycline 100 MG capsule Commonly known as: VIBRAMYCIN Take 1 capsule (100 mg total) by mouth 2 (two) times daily for 5 days.   guaiFENesin 600 MG 12 hr tablet Commonly known as: MUCINEX Take 2 tablets (1,200 mg total) by mouth 2 (two) times daily for 5 days.   ipratropium-albuterol 0.5-2.5 (3) MG/3ML Soln Commonly known as: DUONEB Take 3 mLs by nebulization in the morning, at noon, and at bedtime.   lisinopril 2.5 MG tablet Commonly known as: ZESTRIL Take 2.5 mg by mouth daily.   metFORMIN 500 MG tablet Commonly known as: GLUCOPHAGE Take 500 mg by mouth 2 (two) times daily.   PARoxetine 20 MG tablet Commonly known as: PAXIL Take 20 mg by mouth daily.        History of Present Illness  11/10/2019  Pulmonary/ 1st office eval/ Dominic [name] / [place] [facility] asbestosis Chief Complaint  Patient presents with  . Consult    Shortness of breath with exertion. Fine when resting. Left side of head stays stopped up and drains and makes him have dry cough. Worked in [employer] since 11th grade and was around [employer].  Dyspnea:  Can't do a whole [employer] / one flight of steps is difficult / mb is 200 ft flat s stopping with sats never lower than 93%  Cough: hoarse, sensation of throat tickles x one year  On acei no mucus prod Sleep: nose stopped up / on back bed is flat/ one pillow  SABA use:  Albuterol tid not prn - not sure it helps rec Only use your albuterol as a rescue medication  Also ok  Try albuterol 15 min before an activity  Make sure you check your oxygen saturations at highest level of activity to be sure it stays over 90% and keep track of it at least once a week, more often  if breathing getting worse, and let me know if losing ground.  Stop lisinopril and start losartan 25 mg one daily instead     02/15/2020  f/u ov/[place] [facility]/Dominic [name] re: asbestos pleural dz/  Asbestosis  COPD GOLD 0  Essential hypertension No chief complaint on file. Dyspnea:   Sob back to house from mb is about 500 ft flat  About 50% improved  Cough: same  nose still gets clogged up at hs and all day long Sleeping: bed flat/ does better on back  SABA use: only uses before ex and it doesn't help 02: none / no desats    No obvious day to day or daytime variability or assoc excess/ purulent sputum or mucus plugs or hemoptysis or cp or chest tightness, subjective wheeze or overt sinus or hb symptoms.   93 without nocturnal  or early am exacerbation  of respiratory  c/o's or need for noct saba. Also denies any obvious fluctuation of symptoms with weather or environmental changes or other aggravating or alleviating factors except as outlined above   No unusual exposure hx or h/o childhood pna/ asthma or knowledge of premature birth.  Current Allergies, Complete Past Medical  History, Past Surgical History, Family History, and Social History were reviewed in [facility] record.  ROS  The following are not active complaints unless bolded Hoarseness, sore throat, dysphagia, dental problems, itching, sneezing,  nasal congestion or discharge of excess mucus or purulent secretions, ear ache,   fever, chills, sweats, unintended wt loss or wt gain, classically pleuritic or exertional cp,  orthopnea pnd or arm/hand swelling  or leg swelling, presyncope, palpitations, abdominal pain, anorexia, nausea, vomiting, diarrhea  or change in bowel habits or change in bladder habits, change in stools or change in urine, dysuria, hematuria,  rash, arthralgias, visual complaints, headache, numbness, weakness or ataxia or problems with walking or coordination,  change in mood or  memory.         Current Meds  Medication Sig  . albuterol (VENTOLIN HFA) 108 (90 Base) MCG/ACT inhaler Inhale 2 puffs into the lungs every 4 (four) hours as needed for wheezing or shortness of breath (cough, shortness of breath or wheezing.).  Marland Kitchen Ascorbic Acid (VITAMIN C) 100 MG tablet Take 100 mg by mouth daily.  Marland Kitchen atorvastatin (LIPITOR) 20 MG tablet Take 20 mg by mouth daily.  . B Complex Vitamins (B COMPLEX 100 PO) Take 1 tablet by mouth daily.  . Cholecalciferol ([employer] VIT D 5000 HIGH-POTENCY PO) Take 1 tablet by mouth daily.  . coenzyme Q10-levOCARNitine (CO Q-10 PLUS) 100-20 MG CAPS Take 200 mg by mouth daily.  . Flaxseed, Linseed, (FLAX SEED OIL) 1000 MG CAPS Take 1 capsule by mouth daily.  Marland Kitchen losartan (COZAAR) 25 MG tablet Take 1 tablet (25 mg total) by mouth daily.  . metFORMIN (GLUCOPHAGE) 500 MG tablet Take 500 mg by mouth 2 (two) times daily.  . Omega-3 Fatty Acids (FISH OIL PO) Take 1 capsule by mouth daily.  Marland Kitchen PARoxetine (PAXIL) 20 MG tablet Take 20 mg by mouth daily.  Marland Kitchen VITAMIN A PO Take 1 tablet by mouth daily.  . vitamin B-12 (CYANOCOBALAMIN) 500 MCG tablet Take 500 mcg by mouth daily.             Past Medical History:  Diagnosis Date  . Asbestos exposure   . Diabetes mellitus without complication (HCC)   . Emphysema lung (HCC)          Objective:     Wt Readings from Last 3 Encounters:  02/15/20 189 lb (85.7 kg)  11/10/19 185 lb (83.9 kg)  10/05/19 200 lb (90.7 kg)    Hoarse pleasant amb wm nad/ nasal tone to voce  Vital signs reviewed - Note on arrival 02/15/2020  02 sats 93% on RA        HEENT : pt wearing mask not removed for exam due to covid -19 concerns.    NECK :  without JVD/Nodes/TM/ nl carotid upstrokes bilaterally   LUNGS: no acc muscle use,  Nl contour chest with min insp crackles/pops/squeaks  bilaterally without cough on insp or exp maneuvers   CV:  RRR  no s3 or murmur or increase in P2, and no edema   ABD:  soft and nontender with nl inspiratory  excursion in the supine position. No bruits or organomegaly appreciated, bowel sounds nl  MS:  Nl gait/ ext warm without deformities, calf tenderness, cyanosis   - mild clubbing No obvious joint restrictions   SKIN: warm and dry without lesions    NEURO:  alert, approp, nl sensorium with  no motor or cerebellar deficits apparent.       Labs ordered  02/15/2020  :  allergy profile   alpha one AT phenotype    BNP   02/15/2020   = 45   Lab Results  Component Value Date   TSH 1.219 02/15/2020       Lab Results  Component Value Date   ESRSEDRATE 67 (H) 02/15/2020       Assessment

## 2020-02-15 NOTE — Patient Instructions (Signed)
Nasacort 1-2 hours each nostril point it toward ear on same side  I emphasized that nasal steroids (nasacort)  have no immediate benefit in terms of improving symptoms.  To help them reached the target tissue, the patient should use Afrin two puffs every 12 hours applied one min before using the nasal steroids.  Afrin should be stopped after no more than 5 days.  If the symptoms worsen, Afrin can be restarted after 5 days off of therapy to prevent rebound congestion from overuse of Afrin.  I also emphasized that in no way are nasal steroids a concern in terms of "addiction".   Depomedrol 120 mg IM   Only use your albuterol as a rescue medication to be used if you can't catch your breath by resting or doing a relaxed purse lip breathing pattern.  - The less you use it, the better it will work when you need it. - Ok to use up to 2 puffs  every 4 hours if you must but call for immediate appointment if use goes up over your usual need - Don't leave home without it !!  (think of it like the spare tire for your car)   Please remember to go to the lab department @ [facility] for your tests - we will call you with the results when they are available.        Please schedule a follow up visit in 3 months but call sooner if needed

## 2020-02-16 ENCOUNTER — Telehealth: Payer: Self-pay | Admitting: Internal Medicine

## 2020-02-16 ENCOUNTER — Encounter: Payer: Self-pay | Admitting: Internal Medicine

## 2020-02-16 MED ORDER — TRIAMCINOLONE ACETONIDE 55 MCG/ACT NA AERO: 2.0000 | INHALATION_SPRAY | Freq: Every day | NASAL | 12 refills | Status: DC

## 2020-02-16 NOTE — Assessment & Plan Note (Signed)
Clinical dx only 02/15/2020  > try depomedrol x 120 mg and nasacort and f/u with sinus CT vs ent eval   Advised: I emphasized that nasal steroids have no immediate benefit in terms of improving symptoms.  To help them reached the target tissue, the patient should use Afrin two puffs every 12 hours applied one min before using the nasal steroids.  Afrin should be stopped after no more than 5 days.  If the symptoms worsen, Afrin can be restarted after 5 days off of therapy to prevent rebound congestion from overuse of Afrin.  I also emphasized that in no way are nasal steroids a concern in terms of "addiction".          Each maintenance medication was reviewed in detail including emphasizing most importantly the difference between maintenance and prns and under what circumstances the prns are to be triggered using an action plan format where appropriate.  Total time for H and P, chart review, counseling, teaching device and generating customized AVS unique to this office visit / charting  > 30 min

## 2020-02-16 NOTE — Assessment & Plan Note (Signed)
Long h/o exposure hx including working for [employer] in [place] in the ? 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 No definitive imaging findings to suggest asbestosis at this time. 2. Small pulmonary nodules scattered throughout the lungs bilaterally, stable compared to prior examinations, largest of which has a mean diameter of 8 mm in the left lower lobe. Non-contrast chest CT at 12 months is recommended> placed in reminder file for 02/04/21  Although certainly at risk, no ILD at this point to support dx of asbestosis but will need f/u on lung nodules > already in reminder file

## 2020-02-16 NOTE — Assessment & Plan Note (Signed)
Quit smoking 1980  - Emphysema on CT chest 04/27/19 - PFT's  02/06/2020  FEV1 1.65 (55 % ) ratio 0.71  p 4 % improvement from saba p 0 prior to study with DLCO  15.17 (60%) corrects to 3.70 (95%)  for alv volume and FV curve mild concavity  - Labs ordered 02/15/2020  :     alpha one AT phenotype   (since has emphysema on ct)   At this point not likely more aggressive rx for copd will do much so will focus on his main resp symptoms > severe rhinitis (see separate a/p)   In meantime reviewed approp rx for saba prn

## 2020-02-16 NOTE — Telephone Encounter (Signed)
Called and spoke with patient who is requesting RX for Nasacort be sent to pharmacy. Patient advised of preferred pharamcy. RS sent in. Nothing further needed at this time.

## 2020-02-17 LAB — IGE: IgE (Immunoglobulin E), Serum: 41 IU/mL (ref 6–495)

## 2020-02-19 LAB — ALPHA-1 ANTITRYPSIN PHENOTYPE: A-1 Antitrypsin, Ser: 84 mg/dL — ABNORMAL LOW (ref 101–187)

## 2020-02-21 ENCOUNTER — Encounter: Payer: Self-pay | Admitting: *Deleted

## 2020-02-23 ENCOUNTER — Ambulatory Visit (HOSPITAL_COMMUNITY)
Admission: RE | Admit: 2020-02-23 | Discharge: 2020-02-23 | Disposition: A | Discharge status: Home / Self Care | Payer: Medicare Other | Source: Ambulatory Visit | Attending: Internal Medicine | Admitting: Internal Medicine

## 2020-02-23 ENCOUNTER — Other Ambulatory Visit: Payer: Self-pay

## 2020-02-23 DIAGNOSIS — J329 Chronic sinusitis, unspecified: Secondary | ICD-10-CM | POA: Diagnosis present

## 2020-02-23 DIAGNOSIS — R059 Cough, unspecified: Secondary | ICD-10-CM | POA: Insufficient documentation

## 2020-02-26 ENCOUNTER — Other Ambulatory Visit: Payer: Self-pay | Admitting: Internal Medicine

## 2020-02-26 DIAGNOSIS — J339 Nasal polyp, unspecified: Secondary | ICD-10-CM | POA: Insufficient documentation

## 2020-02-26 NOTE — Progress Notes (Signed)
Spoke with pt and notified of results per Dr. [name]. Pt verbalized understanding and denied any questions. Pt agrees to ENT referral and order was placed.

## 2020-06-11 ENCOUNTER — Telehealth: Payer: Self-pay | Admitting: Internal Medicine

## 2020-06-18 ENCOUNTER — Other Ambulatory Visit: Payer: Self-pay

## 2020-06-18 ENCOUNTER — Ambulatory Visit (HOSPITAL_COMMUNITY)
Admission: RE | Admit: 2020-06-18 | Discharge: 2020-06-18 | Disposition: A | Discharge status: Home / Self Care | Payer: Medicare Other | Source: Ambulatory Visit | Attending: Internal Medicine | Admitting: Internal Medicine

## 2020-06-18 ENCOUNTER — Ambulatory Visit: Payer: Medicare Other | Admitting: Internal Medicine

## 2020-06-18 ENCOUNTER — Encounter: Payer: Self-pay | Admitting: Internal Medicine

## 2020-06-18 VITALS — BP 104/70 | HR 77 | Temp 97.2°F | Ht 71.0 in | Wt 191.6 lb

## 2020-06-18 DIAGNOSIS — J61 Pneumoconiosis due to asbestos and other mineral fibers: Secondary | ICD-10-CM

## 2020-06-18 DIAGNOSIS — J441 Chronic obstructive pulmonary disease with (acute) exacerbation: Secondary | ICD-10-CM | POA: Diagnosis present

## 2020-06-18 DIAGNOSIS — J449 Chronic obstructive pulmonary disease, unspecified: Secondary | ICD-10-CM

## 2020-06-18 MED ORDER — BREZTRI AEROSPHERE 160-9-4.8 MCG/ACT IN AERO: 2.0000 | INHALATION_SPRAY | Freq: Two times a day (BID) | RESPIRATORY_TRACT | 0 refills | Status: DC

## 2020-06-18 MED ORDER — METHYLPREDNISOLONE ACETATE 80 MG/ML IJ SUSP: 120.0000 mg | Freq: Once | INTRAMUSCULAR | Status: DC

## 2020-06-18 MED ORDER — PREDNISONE 10 MG PO TABS: ORAL_TABLET | ORAL | 0 refills | Status: DC

## 2020-06-18 MED ORDER — METHYLPREDNISOLONE ACETATE 80 MG/ML IJ SUSP
120.0000 mg | Freq: Once | INTRAMUSCULAR | Status: AC
  Administered 2020-06-18: 120 mg via INTRAMUSCULAR

## 2020-06-18 MED ORDER — AZITHROMYCIN 250 MG PO TABS: ORAL_TABLET | ORAL | 0 refills | Status: DC

## 2020-06-18 NOTE — Patient Instructions (Addendum)
Plan A = Automatic = Always=    Breztri Take 2 puffs first thing in am and then another 2 puffs about 12 hours later.   Work on inhaler technique:  relax and gently blow all the way out then take a nice smooth deep breath back in, triggering the inhaler at same time you start breathing in.  Hold for up to 5 seconds if you can. Blow out thru nose. Rinse and gargle with water when done   Prednisone 10 mg take  4 each am x 2 days,   2 each am x 2 days,  1 each am x 2 days and stop   depomedrol 120 mg IM   Plan B = Backup (to supplement plan A, not to replace it) Only use your albuterol inhaler as a rescue medication to be used if you can't catch your breath by resting or doing a relaxed purse lip breathing pattern.  - The less you use it, the better it will work when you need it. - Ok to use the inhaler up to 2 puffs  every 4 hours if you must but call for appointment if use goes up over your usual need - Don't leave home without it !!  (think of it like the spare tire for your car)     zpak should turn mucus clear  For cough / congestion >  mucinex dm 1200 mg every 12 hours as needed   Please remember to go to the  x-ray department  @  [facility] for your tests - we will call you with the results when they are available     Please schedule a follow up office visit in 2  weeks, sooner if needed

## 2020-06-18 NOTE — Progress Notes (Signed)
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 Follow up with PCP in 1 weeks 2. Follow up with pulmonologist as scheduled  Discharge Condition: STABLE   CODE STATUS: FULL    Brief Hospitalization Summary: Please see all hospital notes, images, labs for full details of the hospitalization. ADMISSION YJE:HUDJSH [name] a 80 y.o.malewith medical history significantfor COPD (emphysema), type 2 diabetes mellitus, asbestos exposure who presented to ED from home with 4 days of dry hacking cough, chest congestion, shortness of breath, pleuritic chest wall pain, low grade fever and malaise. Pt reports that he has had both covid vaccines and is fully vaccinated for covid 19. He reports that his exercise tolerance is diminished and he feels overall much weaker over last several days.   ED Course:pt was tachypneic and hypoxic with pulse ox of 88%, placed on 2L [place] with improvement,temperature 99.7, respirations 23, blood pressure 112/66 pulse ox improved to 95% on 2 L nasal cannula, sodium 1, potassium 4.1, glucose 153, creatinine 1.22, total bilirubin 1.6, BNP 274.0, troponin XX 6, lactic acid 2.4, WBC 13.1, hemoglobin 11.6, platelet count 211. SARS 2 COVID-19 test negative, portable chest x-ray with patchy left lower lobe pneumonia findings. Blood cultures x2 taken, IV antibiotics started and admission requested.  1. Acute respiratory failure with  hypoxia secondary to community-acquired pneumonia-RESOLVED. With patient's advanced age and comorbidities and high mortality risk he was admitted inpatient for IV antibiotics and supportive care. He was treated with Rocephin and azithromycin.he is clinically improved. Anticipate DC home 7/24. Blood cultures. No growth to date. He will qualify for home oxygen and he will be sent home on oxygen as arranged by [facility] service.  He does well on 4L [place].  He has outpatient appt with Dr. [name] pulmonologist in August 2021.  2. Poorly controlledType 2 diabetes mellitus-as evidenced byhemoglobin A1c 7.3%,treated withsupplemental sliding scale insulin and CBG monitoring.  Resume home treatment plan.  3. COPD-duo nebs ordered TID, continue, mucolytics and respiratory care.  Arrange for home nebulizer supplies and duonebs.  4. Sepsis secondary to pneumonia -RESOLVED. Patient presented with tachypnea, hypotension, community-acquired pneumonia and high lactic acid levels. Hewastreated with the sepsis protocol and he is much improved. 5. Lactic acidosis-treated and resolved. 6. Hypotension - BP improved after IV fluid bolus.  DVT prophylaxis:Lovenox Code Status:Full Family Communication:grandson at bedside updated Disposition Plan:Home Consults called:N/A Admission status:INP   Discharge Diagnoses:  Principal Problem:   CAP (community acquired pneumonia) Active Problems:   Diabetes mellitus without complication (HCC)   Emphysema lung (HCC)   Acute respiratory failure with hypoxia (HCC)   Asbestos exposure   Sepsis due to pneumonia (HCC)   Lactic acidosis   Hyponatremia   Hypotension   Discharge Instructions: Allergies as of 10/07/2019   No Known Allergies              Medication List        TAKE these medications  albuterol 108 (90 Base) MCG/ACT inhaler Commonly known as: VENTOLIN HFA Inhale 2 puffs into the lungs every 4 (four) hours as needed for wheezing  or shortness of breath (cough, shortness of breath or wheezing.).   atorvastatin 20 MG tablet Commonly known as: LIPITOR Take 20 mg by mouth daily.   dextromethorphan 30 MG/5ML liquid Commonly known as: Delsym Take 5 mLs (30 mg total) by mouth 2 (two) times daily as needed for cough.   doxycycline 100 MG capsule Commonly known as: VIBRAMYCIN Take 1 capsule (100 mg total) by mouth 2 (two) times daily for 5 days.   guaiFENesin 600 MG 12 hr tablet Commonly known as: MUCINEX Take 2 tablets (1,200 mg total) by mouth 2 (two) times daily for 5 days.   ipratropium-albuterol 0.5-2.5 (3) MG/3ML Soln Commonly known as: DUONEB Take 3 mLs by nebulization in the morning, at noon, and at bedtime.   lisinopril 2.5 MG tablet Commonly known as: ZESTRIL Take 2.5 mg by mouth daily.   metFORMIN 500 MG tablet Commonly known as: GLUCOPHAGE Take 500 mg by mouth 2 (two) times daily.   PARoxetine 20 MG tablet Commonly known as: PAXIL Take 20 mg by mouth daily.        History of Present Illness  11/10/2019  Pulmonary/ 1st office eval/ [facility] / [facility] asbestosis Chief Complaint  Patient presents with  . Consult    Shortness of breath with exertion. Fine when resting. Left side of head stays stopped up and drains and makes him have dry cough. Worked in [employer] since 11th grade and was around [employer].  Dyspnea:  Can't do a whole food lion / one flight of steps is difficult / mb is 200 ft flat s stopping with sats never lower than 93%  Cough: hoarse, sensation of throat tickles x one year  On acei no mucus prod Sleep: nose stopped up / on back bed is flat/ one pillow  SABA use:  Albuterol tid not prn - not sure it helps rec Only use your albuterol as a rescue medication  Also ok  Try albuterol 15 min before an activity  Make sure you check your oxygen saturations at highest level of activity to be sure it stays over 90% and keep track of it at least once a week, more often  if breathing getting worse, and let me know if losing ground.  Stop lisinopril and start losartan 25 mg one daily instead     02/15/2020  f/u ov/[facility]/Camy Leder re: asbestos pleural dz/  Asbestosis  COPD GOLD 0  Essential hypertension . 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 Acute Visit    Increased SOB, cough with minimal brown sputum, wheezing for approx 1 wk. He is using his albuterol inhaler 10 x daily  on average.   Dyspnea: much worse x one  week Cough: assoc with brown mucus   no assoc fever,  SABA use: as above / last used it 30 min prior x 2 puffs, had not been using at all prior to a week prior to OV   02: none / sats  Covid status: vax x 3 Lung cancer screening: > 15 y since smoked    No obvious day to day or daytime variability or assoc   mucus plugs or hemoptysis or cp or chest tightness, subjective wheeze or overt sinus or hb symptoms.     Also denies any obvious fluctuation of symptoms with weather or environmental changes or other aggravating or alleviating factors except as outlined above   No unusual exposure hx or h/o childhood pna/ asthma or knowledge of premature birth.  Current Allergies, Complete Past Medical History, Past Surgical History, Family History, and Social History were reviewed in [facility] record.  ROS  The  following are not active complaints unless bolded Hoarseness, sore throat, dysphagia, dental problems, itching, sneezing,  nasal congestion or discharge of excess mucus or purulent secretions, ear ache,   fever, chills, sweats, unintended wt loss or wt gain, classically pleuritic or exertional cp,  orthopnea pnd or arm/hand swelling  or leg swelling, presyncope, palpitations, abdominal pain, anorexia, nausea, vomiting, diarrhea  or change in bowel habits or change in bladder habits, change in stools or change in urine, dysuria, hematuria,  rash, arthralgias, visual complaints, headache, numbness, weakness or ataxia or problems with walking or coordination,  change in mood or  memory.        Current Meds  Medication Sig  . albuterol (VENTOLIN HFA) 108 (90 Base) MCG/ACT inhaler Inhale 2 puffs into the lungs every 4 (four) hours as needed for wheezing or shortness of breath (cough, shortness of breath or wheezing.).  Marland Kitchen Ascorbic Acid (VITAMIN C) 100 MG tablet Take 100 mg by mouth daily.  Marland Kitchen atorvastatin (LIPITOR) 20 MG tablet Take 20 mg by mouth daily.  . B Complex Vitamins (B COMPLEX 100 PO) Take 1 tablet by mouth daily.  . Cholecalciferol ([employer] VIT D 5000 HIGH-POTENCY PO) Take 1 tablet by mouth daily.  . coenzyme Q10-levOCARNitine (CO Q-10 PLUS) 100-20 MG CAPS Take 200 mg by mouth daily.  Marland Kitchen losartan (COZAAR) 25 MG tablet Take 1 tablet (25 mg total) by mouth daily.  . metFORMIN (GLUCOPHAGE) 500 MG tablet Take 500 mg by mouth 2 (two) times daily.  Marland Kitchen PARoxetine (PAXIL) 20 MG tablet Take 20 mg by mouth daily.  Marland Kitchen triamcinolone (NASACORT) 55 MCG/ACT AERO nasal inhaler Place 2 sprays into the nose daily.  Marland Kitchen VITAMIN A PO Take 1 tablet by mouth daily.  . vitamin B-12 (CYANOCOBALAMIN) 500 MCG tablet Take 500 mcg by mouth daily.        Past Medical History:  Diagnosis Date  . Asbestos exposure   . Diabetes mellitus without complication (HCC)   . Emphysema lung (HCC)      Objective:     06/18/2020          191  02/15/20 189 lb (85.7 kg)  11/10/19 185 lb (83.9 kg)  10/05/19 200 lb (90.7 kg)    Vital signs reviewed  06/18/2020  - Note at rest 02 sats  96% on RA   General appearance:    amb hoarse wm nad   HEENT : pt wearing mask not removed for exam due to covid - 19 concerns.  NECK :  without JVD/Nodes/TM/ nl carotid upstrokes bilaterally   LUNGS: no acc muscle use,  Min barrel  contour chest wall with bilateral  Faint ins/exp rhonchi and insp crackles in bases  and  without cough on insp or exp maneuvers and min  Hyperresonant  to  percussion bilaterally     CV:  RRR  no s3 or murmur or increase in P2, and no edema   ABD:  soft and nontender with pos end  insp Hoover's  in the supine position. No bruits or organomegaly appreciated, bowel sounds nl  MS:   Nl gait/  ext warm without deformities, calf tenderness, cyanosis or clubbing No obvious joint restrictions   SKIN: warm and dry without lesions    NEURO:  alert, approp, nl sensorium with  no motor or cerebellar deficits apparent.        CXR PA and Lateral:   06/18/2020 :    I personally reviewed images / impression as follows:   Severe scarring with pleural calcifcations R > L s convincing acute changes          Assessment

## 2020-06-18 NOTE — Assessment & Plan Note (Signed)
Long h/o exposure hx including working for [employer] in [place] in the ? 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 No definitive imaging findings to suggest asbestosis at this time. 2. Small pulmonary nodules scattered throughout the lungs bilaterally, stable compared to prior examinations, largest of which has a mean diameter of 8 mm in the left lower lobe. Non-contrast chest CT at 12 months is recommended> placed in reminder file for 02/04/21  No significant change on plain cxr 06/18/2020

## 2020-06-20 ENCOUNTER — Encounter: Payer: Self-pay | Admitting: *Deleted

## 2020-07-02 ENCOUNTER — Ambulatory Visit: Payer: Medicare Other | Admitting: Internal Medicine

## 2020-07-02 NOTE — Progress Notes (Deleted)
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 Follow up with PCP in 1 weeks 2. Follow up with pulmonologist as scheduled  Discharge Condition: STABLE   CODE STATUS: FULL    Brief Hospitalization Summary: Please see all hospital notes, images, labs for full details of the hospitalization. ADMISSION YJE:HUDJSH [name] a 80 y.o.malewith medical history significantfor COPD (emphysema), type 2 diabetes mellitus, asbestos exposure who presented to ED from home with 4 days of dry hacking cough, chest congestion, shortness of breath, pleuritic chest wall pain, low grade fever and malaise. Pt reports that he has had both covid vaccines and is fully vaccinated for covid 19. He reports that his exercise tolerance is diminished and he feels overall much weaker over last several days.   ED Course:pt was tachypneic and hypoxic with pulse ox of 88%, placed on 2L [place] with improvement,temperature 99.7, respirations 23, blood pressure 112/66 pulse ox improved to 95% on 2 L nasal cannula, sodium 1, potassium 4.1, glucose 153, creatinine 1.22, total bilirubin 1.6, BNP 274.0, troponin XX 6, lactic acid 2.4, WBC 13.1, hemoglobin 11.6, platelet count 211. SARS 2 COVID-19 test negative, portable chest x-ray with patchy left lower lobe pneumonia findings. Blood cultures x2 taken, IV antibiotics started and admission requested.  1. Acute respiratory failure with  hypoxia secondary to community-acquired pneumonia-RESOLVED. With patient's advanced age and comorbidities and high mortality risk he was admitted inpatient for IV antibiotics and supportive care. He was treated with Rocephin and azithromycin.he is clinically improved. Anticipate DC home 7/24. Blood cultures. No growth to date. He will qualify for home oxygen and he will be sent home on oxygen as arranged by [facility].  He does well on 4L [place].  He has outpatient appt with Dr. [name] pulmonologist in August 2021.  2. Poorly controlledType 2 diabetes mellitus-as evidenced byhemoglobin A1c 7.3%,treated withsupplemental sliding scale insulin and CBG monitoring.  Resume home treatment plan.  3. COPD-duo nebs ordered TID, continue, mucolytics and respiratory care.  Arrange for home nebulizer supplies and duonebs.  4. Sepsis secondary to pneumonia -RESOLVED. Patient presented with tachypnea, hypotension, community-acquired pneumonia and high lactic acid levels. Hewastreated with the sepsis protocol and he is much improved. 5. Lactic acidosis-treated and resolved. 6. Hypotension - BP improved after IV fluid bolus.  DVT prophylaxis:Lovenox Code Status:Full Family Communication:grandson at bedside updated Disposition Plan:Home Consults called:N/A Admission status:INP   Discharge Diagnoses:  Principal Problem:   CAP (community acquired pneumonia) Active Problems:   Diabetes mellitus without complication (HCC)   Emphysema lung (HCC)   Acute respiratory failure with hypoxia (HCC)   Asbestos exposure   Sepsis due to pneumonia (HCC)   Lactic acidosis   Hyponatremia   Hypotension   Discharge Instructions: Allergies as of 10/07/2019   No Known Allergies              Medication List        TAKE these medications  albuterol 108 (90 Base) MCG/ACT inhaler Commonly known as: VENTOLIN HFA Inhale 2 puffs into the lungs every 4 (four) hours as needed for wheezing  or shortness of breath (cough, shortness of breath or wheezing.).   atorvastatin 20 MG tablet Commonly known as: LIPITOR Take 20 mg by mouth daily.   dextromethorphan 30 MG/5ML liquid Commonly known as: Delsym Take 5 mLs (30 mg total) by mouth 2 (two) times daily as needed for cough.   doxycycline 100 MG capsule Commonly known as: VIBRAMYCIN Take 1 capsule (100 mg total) by mouth 2 (two) times daily for 5 days.   guaiFENesin 600 MG 12 hr tablet Commonly known as: MUCINEX Take 2 tablets (1,200 mg total) by mouth 2 (two) times daily for 5 days.   ipratropium-albuterol 0.5-2.5 (3) MG/3ML Soln Commonly known as: DUONEB Take 3 mLs by nebulization in the morning, at noon, and at bedtime.   lisinopril 2.5 MG tablet Commonly known as: ZESTRIL Take 2.5 mg by mouth daily.   metFORMIN 500 MG tablet Commonly known as: GLUCOPHAGE Take 500 mg by mouth 2 (two) times daily.   PARoxetine 20 MG tablet Commonly known as: PAXIL Take 20 mg by mouth daily.        History of Present Illness  11/10/2019  Pulmonary/ 1st office eval/ [facility] / [facility]best[facility] Chief Complaint  Patient presents with  . Consult    Shortness of breath with exertion. Fine when resting. Left side of head stays stopped up and drains and makes him have dry cough. Worked in [employer] since 11th grade and was around [employer].  Dyspnea:  Can't do a whole [employer] lion / one flight of steps is difficult / mb is 200 ft flat s stopping with sats never lower than 93%  Cough: hoarse, sensation of throat tickles x one year  On acei no mucus prod Sleep: nose stopped up / on back bed is flat/ one pillow  SABA use:  Albuterol tid not prn - not sure it helps rec Only use your albuterol as a rescue medication  Also ok  Try albuterol 15 min before an activity  Make sure you check your oxygen saturations at highest level of activity to be sure it stays over 90% and keep track of it at least once a week, more often  if breathing getting worse, and let me know if losing ground.  Stop lisinopril and start losartan 25 mg one daily instead     02/15/2020  f/u ov/[facility]/Dinita Migliaccio re: asbestos pleural dz/  Asbestosis  COPD GOLD 0  Essential hypertension . 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 Acute Visit    Increased SOB, cough with minimal brown sputum, wheezing for approx 1 wk. He is using his albuterol inhaler 10 x daily  on average.   Dyspnea: much worse x one  week Cough: assoc with brown mucus   no assoc fever,  SABA use: as above / last used it 30 min prior x 2 puffs, had not been using at all prior to a week prior to OV   02: none / sats  Covid status: vax x 3 Lung cancer screening: > 15 y since smoked  rec Plan A = Automatic = Always=    Breztri Take 2 puffs first thing in am and then another 2 puffs about 12 hours later.  Work on inhaler technique:  relax and gently blow all the way out then take a nice smooth deep breath back in, triggering the inhaler at same time you start breathing in.  Hold for up to 5 seconds if you can. Blow out thru nose. Rinse and gargle with water when done Prednisone 10 mg take  4 each am x 2 days,   2 each am x 2 days,  1 each am x 2 days and stop  depomedrol 120 mg IM  Plan B = Backup (to supplement plan A, not to replace it) Only use your albuterol  inhaler as a rescue medication  zpak should turn mucus clear For cough / congestion >  mucinex dm 1200 mg every 12 hours as needed  Please remember to go to the  cx-ray department  : Similar appearance of emphysema, pleural plaques, and chronic lung changes, particularly at the right lung base, without definite evidence of superimposed acute cardiopulmonary disease      07/02/2020  f/u ov/[place] office/Saylor Murry re:  No chief complaint on file.   Dyspnea:  *** Cough: *** Sleeping: *** SABA use: *** 02: *** Covid status: *** Lung cancer screening: ***   No obvious day to day or daytime variability or assoc excess/ purulent sputum or mucus plugs or hemoptysis or cp or chest tightness, subjective wheeze or overt sinus or hb symptoms.   *** without nocturnal  or early am exacerbation  of respiratory  c/o's or need for noct saba. Also denies any obvious fluctuation of symptoms with weather or environmental changes or other aggravating or alleviating factors except as outlined above   No unusual exposure hx or h/o childhood pna/ asthma or knowledge of premature birth.  Current Allergies, Complete Past Medical History, Past Surgical History, Family History, and Social History were reviewed in [employer] [facility] record.  ROS  The following are not active complaints unless bolded Hoarseness, sore throat, dysphagia, dental problems, itching, sneezing,  nasal congestion or discharge of excess mucus or purulent secretions, ear ache,   fever, chills, sweats, unintended wt loss or wt gain, classically pleuritic or exertional cp,  orthopnea pnd or arm/hand swelling  or leg swelling, presyncope, palpitations, abdominal pain, anorexia, nausea, vomiting, diarrhea  or change in bowel habits or change in bladder habits, change in stools or change in urine, dysuria, hematuria,  rash, arthralgias, visual complaints, headache, numbness, weakness or ataxia or problems with walking or  coordination,  change in mood or  memory.        No outpatient medications have been marked as taking for the 07/02/20 encounter (Appointment) with Nyoka Cowden, MD.   Current Facility-Administered Medications for the 07/02/20 encounter (Appointment) with Nyoka Cowden, MD  Medication  . methylPREDNISolone acetate (DEPO-MEDROL) injection 120 mg             Past Medical History:  Diagnosis Date  . Asbestos exposure   .  Diabetes mellitus without complication (HCC)   . Emphysema lung (HCC)      Objective:    07/02/2020       ***  06/18/2020         191  02/15/20 189 lb (85.7 kg)  11/10/19 185 lb (83.9 kg)  10/05/19 200 lb (90.7 kg)    Vital signs reviewed  07/02/2020  - Note at rest 02 sats  ***% on ***   General appearance:    ***     Min bar***           Assessment

## 2020-07-05 NOTE — Telephone Encounter (Signed)
Pt did have OV with MW 4/5 and is scheduled for a follow up with him 5/2. Nothing further needed.

## 2020-07-15 ENCOUNTER — Ambulatory Visit: Payer: Medicare Other | Admitting: Internal Medicine

## 2020-07-15 ENCOUNTER — Encounter: Payer: Self-pay | Admitting: Internal Medicine

## 2020-07-15 ENCOUNTER — Other Ambulatory Visit: Payer: Self-pay

## 2020-07-15 DIAGNOSIS — J449 Chronic obstructive pulmonary disease, unspecified: Secondary | ICD-10-CM | POA: Diagnosis not present

## 2020-07-15 MED ORDER — BREZTRI AEROSPHERE 160-9-4.8 MCG/ACT IN AERO: 2.0000 | INHALATION_SPRAY | Freq: Two times a day (BID) | RESPIRATORY_TRACT | 0 refills | Status: DC

## 2020-07-15 NOTE — Patient Instructions (Addendum)
Ok to Try albuterol 15 min before an activity that you know would make you short of breath and see if it makes any difference and if makes none then don't take it after activity unless you can't catch your breath.      Try to wean the Markus Daft a week a time  1) first try one twice daily  X one week  2) One each am x 1 week  Then stop    Make sure you check your oxygen saturation at your highest level of activity to be sure it stays over 90% and keep track of it at least once a week, more often if breathing getting worse, and let me know if losing ground.    If you are satisfied with your treatment plan,  let your doctor know and he/she can either refill your medications or you can return here when your prescription runs out.     If in any way you are not 100% satisfied,  please tell us.  If 100% better, tell your friends!  Pulmonary follow up is as needed

## 2020-07-15 NOTE — Assessment & Plan Note (Signed)
Quit smoking 1980  - Emphysema on CT chest 04/27/19 - PFT's  02/06/2020  FEV1 1.65 (55 % ) ratio 0.71  p 4 % improvement from saba p 0 prior to study with DLCO  15.17 (60%) corrects to 3.70 (95%)  for alv volume and FV curve mild concavity  - Labs ordered 02/15/2020  :     alpha one AT phenotype   (since has emphysema on ct)   MM  Level 84 - 06/18/2020    try breztri 2bid x 2 weeks samples  > ? Better 07/15/2020   - 07/15/2020  After extensive coaching inhaler device,  effectiveness =    90% > try to wean breztri (hoarseness and not sure it really helped)  -  07/15/2020   Walked RA  approx   500 ft  @ fast pace  stopped due to  End of study, very min  sob, sats 93%    Not really clear he benefitted form triple rx and may do just as well with prn saba at this point since understands now how to use hfa effectively and does not actually meed the criteria for copd  Re: saba: I spent extra time with pt today reviewing appropriate use of albuterol for prn use on exertion with the following points: 1) saba is for relief of sob that does not improve by walking a slower pace or resting but rather if the pt does not improve after trying this first. 2) If the pt is convinced, as many are, that saba helps recover from activity faster then it's easy to tell if this is the case by re-challenging : ie stop, take the inhaler, then p 5 minutes try the exact same activity (intensity of workload) that just caused the symptoms and see if they are substantially diminished or not after saba 3) if there is an activity that reproducibly causes the symptoms, try the saba 15 min before the activity on alternate days   If in fact the saba really does help, then fine to continue to use it prn but advised may need to look closer at the maintenance regimen being used to achieve better control of airways disease with exertion.   Used "starter fluid" for analogy for saba and high octane fuel for breztri to help his understand the  difference/ purpose of the two inhalers.  Clearly if losing ground p tapers off breztri ok to restart.   If still not happy with ex tol even back on full dose breztri then CPST in [place] next step  Pulmonary f/u can be prn   Each maintenance medication was reviewed in detail including emphasizing most importantly the difference between maintenance and prns and under what circumstances the prns are to be triggered using an action plan format where appropriate.  Total time for H and P, chart review, counseling, reviewing hfa device(s) , directly observing portions of ambulatory 02 saturation study/ and generating customized AVS unique to this office visit / same day charting = > 30 min

## 2020-07-15 NOTE — Progress Notes (Signed)
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 Follow up with PCP in 1 weeks 2. Follow up with pulmonologist as scheduled  Discharge Condition: STABLE   CODE STATUS: FULL    Brief Hospitalization Summary: Please see all hospital notes, images, labs for full details of the hospitalization. ADMISSION LOV:FIEPPI [name] a 79 y.o.malewith medical history significantfor COPD (emphysema), type 2 diabetes mellitus, asbestos exposure who presented to ED from home with 4 days of dry hacking cough, chest congestion, shortness of breath, pleuritic chest wall pain, low grade fever and malaise. Pt reports that he has had both covid vaccines and is fully vaccinated for covid 19. He reports that his exercise tolerance is diminished and he feels overall much weaker over last several days.   ED Course:pt was tachypneic and hypoxic with pulse ox of 88%, placed on 2L [place] with improvement,temperature 99.7, respirations 23, blood pressure 112/66 pulse ox improved to 95% on 2 L nasal cannula, sodium 1, potassium 4.1, glucose 153, creatinine 1.22, total bilirubin 1.6, BNP 274.0, troponin XX 6, lactic acid 2.4, WBC 13.1, hemoglobin 11.6, platelet count 211. SARS 2 COVID-19 test negative, portable chest x-ray with patchy left lower lobe pneumonia findings. Blood cultures x2 taken, IV antibiotics started and admission requested.  1. Acute respiratory failure with  hypoxia secondary to community-acquired pneumonia-RESOLVED. With patient's advanced age and comorbidities and high mortality risk he was admitted inpatient for IV antibiotics and supportive care. He was treated with Rocephin and azithromycin.he is clinically improved. Anticipate DC home 7/24. Blood cultures. No growth to date. He will qualify for home oxygen and he will be sent home on oxygen as arranged by [facility] service.  He does well on 4L [place].  He has outpatient appt with Dr. [name] pulmonologist in August 2021.  2. Poorly controlledType 2 diabetes mellitus-as evidenced byhemoglobin A1c 7.3%,treated withsupplemental sliding scale insulin and CBG monitoring.  Resume home treatment plan.  3. COPD-duo nebs ordered TID, continue, mucolytics and respiratory care.  Arrange for home nebulizer supplies and duonebs.  4. Sepsis secondary to pneumonia -RESOLVED. Patient presented with tachypnea, hypotension, community-acquired pneumonia and high lactic acid levels. Hewastreated with the sepsis protocol and he is much improved. 5. Lactic acidosis-treated and resolved. 6. Hypotension - BP improved after IV fluid bolus.  DVT prophylaxis:Lovenox Code Status:Full Family Communication:grandson at bedside updated Disposition Plan:Home Consults called:N/A Admission status:INP   Discharge Diagnoses:  Principal Problem:   CAP (community acquired pneumonia) Active Problems:   Diabetes mellitus without complication (HCC)   Emphysema lung (HCC)   Acute respiratory failure with hypoxia (HCC)   Asbestos exposure   Sepsis due to pneumonia (HCC)   Lactic acidosis   Hyponatremia   Hypotension   Discharge Instructions: Allergies as of 10/07/2019   No Known Allergies              Medication List        TAKE these medications  albuterol 108 (90 Base) MCG/ACT inhaler Commonly known as: VENTOLIN HFA Inhale 2 puffs into the lungs every 4 (four) hours as needed for wheezing  or shortness of breath (cough, shortness of breath or wheezing.).   atorvastatin 20 MG tablet Commonly known as: LIPITOR Take 20 mg by mouth daily.   dextromethorphan 30 MG/5ML liquid Commonly known as: Delsym Take 5 mLs (30 mg total) by mouth 2 (two) times daily as needed for cough.   doxycycline 100 MG capsule Commonly known as: VIBRAMYCIN Take 1 capsule (100 mg total) by mouth 2 (two) times daily for 5 days.   guaiFENesin 600 MG 12 hr tablet Commonly known as: MUCINEX Take 2 tablets (1,200 mg total) by mouth 2 (two) times daily for 5 days.   ipratropium-albuterol 0.5-2.5 (3) MG/3ML Soln Commonly known as: DUONEB Take 3 mLs by nebulization in the morning, at noon, and at bedtime.   lisinopril 2.5 MG tablet Commonly known as: ZESTRIL Take 2.5 mg by mouth daily.   metFORMIN 500 MG tablet Commonly known as: GLUCOPHAGE Take 500 mg by mouth 2 (two) times daily.   PARoxetine 20 MG tablet Commonly known as: PAXIL Take 20 mg by mouth daily.        History of Present Illness  11/10/2019  Pulmonary/ 1st [name] eval/ Dominic [name] / [place] [name] asbestosis Chief Complaint  Patient presents with  . Consult    Shortness of breath with exertion. Fine when resting. Left side of head stays stopped up and drains and makes him have dry cough. Worked in [employer] since 11th grade and was around [employer].  Dyspnea:  Can't do a whole [employer] / one flight of steps is difficult / mb is 200 ft flat s stopping with sats never lower than 93%  Cough: hoarse, sensation of throat tickles x one year  On acei no mucus prod Sleep: nose stopped up / on back bed is flat/ one pillow  SABA use:  Albuterol tid not prn - not sure it helps rec Only use your albuterol as a rescue medication  Also ok  Try albuterol 15 min before an activity  Make sure you check your oxygen saturations at highest level of activity to be sure it stays over 90% and keep track of it at least once a week, more often  if breathing getting worse, and let me know if losing ground.  Stop lisinopril and start losartan 25 mg one daily instead     02/15/2020  f/u ov/Dominic [name]/Dominic [name] re: asbestos pleural dz/  Asbestosis  COPD GOLD 0  Essential hypertension . 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 Acute Visit    Increased SOB, cough with minimal brown sputum, wheezing for approx 1 wk. He is using his albuterol inhaler 10 x daily  on average.   Dyspnea: much worse x one  week Cough: assoc with brown mucus   no assoc fever,  SABA use: as above / last used it 30 min prior x 2 puffs, had not been using at all prior to a week prior to OV   02: none / sats  Covid status: vax x 3 Lung cancer screening: > 15 y since smoked  rec Plan A = Automatic = Always=    Breztri Take 2 puffs first thing in am and then another 2 puffs about 12 hours later.  Work on inhaler technique:    depomedrol 120 mg IM  Plan B = Backup (to supplement plan A, not to replace it) Only use your albuterol inhaler as a rescue medication  zpak should turn mucus clear For cough / congestion >  mucinex dm 1200 mg every 12 hours as needed         07/15/2020  f/u ov/[place] [name]/Dominic [name] re: GOLD 0 copd/ asbestos pleural plaques/ rhinitis  Chief Complaint  Patient presents with  . Follow-up    Breathing has improved since the  last visit and no more coughing or wheezing. He is using his albuterol inhaler 3 x per wk on average.   Dyspnea:  Walks a quarter mile with dog occ sob/ mostly just has doe when "toting wood"  Cough: none  Sleeping: fine  SABA use: really only once or twice a week 02: none Covid status: vax x 3 Lung cancer screening: n/a   No obvious day to day or daytime variability or assoc excess/ purulent sputum or mucus plugs or hemoptysis or cp or chest tightness, subjective wheeze or overt sinus or hb symptoms.   Sleeping  without nocturnal  or early am exacerbation  of respiratory  c/o's or need for noct saba. Also denies any obvious fluctuation of symptoms with weather or environmental changes or other aggravating or alleviating factors except as outlined above   No unusual exposure hx or h/o childhood pna/ asthma or knowledge of premature birth.  Current Allergies, Complete Past Medical History, Past Surgical History, Family History, and Social History were reviewed in [employer] record.  ROS  The following are not active complaints unless bolded Hoarseness, sore throat, dysphagia, dental problems, itching, sneezing,  nasal congestion or discharge of excess mucus or purulent secretions, ear ache,   fever, chills, sweats, unintended wt loss or wt gain, classically pleuritic or exertional cp,  orthopnea pnd or arm/hand swelling  or leg swelling, presyncope, palpitations, abdominal pain, anorexia, nausea, vomiting, diarrhea  or change in bowel habits or change in bladder habits, change in stools or change in urine, dysuria, hematuria,  rash, arthralgias, visual complaints, headache, numbness, weakness or ataxia or problems with walking or coordination,  change in mood or  memory.        Current Meds  Medication Sig  . albuterol (VENTOLIN HFA) 108 (90 Base) MCG/ACT inhaler Inhale 2 puffs into the lungs every 4 (four) hours as needed for wheezing or shortness of breath (cough,  shortness of breath or wheezing.).  Marland Kitchen Ascorbic Acid (VITAMIN C) 100 MG tablet Take 100 mg by mouth daily.  Marland Kitchen atorvastatin (LIPITOR) 20 MG tablet Take 20 mg by mouth daily.  . B Complex Vitamins (B COMPLEX 100 PO) Take 1 tablet by mouth daily.  . Budeson-Glycopyrrol-Formoterol (BREZTRI AEROSPHERE) 160-9-4.8 MCG/ACT AERO Inhale 2 puffs into the lungs in the morning and at bedtime.  . Cholecalciferol ([employer] VIT D 5000 HIGH-POTENCY  PO) Take 1 tablet by mouth daily.  . coenzyme Q10-levOCARNitine (CO Q-10 PLUS) 100-20 MG CAPS Take 200 mg by mouth daily.  Marland Kitchen losartan (COZAAR) 25 MG tablet Take 1 tablet (25 mg total) by mouth daily.  . metFORMIN (GLUCOPHAGE) 500 MG tablet Take 500 mg by mouth 2 (two) times daily.  Marland Kitchen PARoxetine (PAXIL) 20 MG tablet Take 20 mg by mouth daily.  Marland Kitchen triamcinolone (NASACORT) 55 MCG/ACT AERO nasal inhaler Place 2 sprays into the nose daily.  Marland Kitchen VITAMIN A PO Take 1 tablet by mouth daily.  . vitamin B-12 (CYANOCOBALAMIN) 500 MCG tablet Take 500 mcg by mouth daily.   Current Facility-Administered Medications for the 07/15/20 encounter ([name] Visit) with Dominic Cowden, MD  Medication  . methylPREDNISolone acetate (DEPO-MEDROL) injection 120 mg             Past Medical History:  Diagnosis Date  . Asbestos exposure   . Diabetes mellitus without complication (HCC)   . Emphysema lung (HCC)      Objective:    07/15/2020         193 06/18/2020         191  02/15/20 189 lb (85.7 kg)  11/10/19 185 lb (83.9 kg)  10/05/19 200 lb (90.7 kg)    Vital signs reviewed  07/15/2020  - Note at rest 02 sats  98% on RA   General appearance:    amb mod hoarse wm and    HEENT : pt wearing mask not removed for exam due to covid - 19 concerns.   NECK :  without JVD/Nodes/TM/ nl carotid upstrokes bilaterally   LUNGS: no acc muscle use,  Min barrel  contour chest wall with bilateral  slightly decreased bs s audible wheeze and  without cough on insp or exp maneuvers and min  Hyperresonant   to  percussion bilaterally     CV:  RRR  no s3 or murmur or increase in P2, and no edema   ABD:  soft and nontender with pos end  insp Hoover's  in the supine position. No bruits or organomegaly appreciated, bowel sounds nl  MS:   Nl gait/  ext warm without deformities, calf tenderness, cyanosis or clubbing No obvious joint restrictions   SKIN: warm and dry without lesions    NEURO:  alert, approp, nl sensorium with  no motor or cerebellar deficits apparent.                Assessment

## 2021-01-29 ENCOUNTER — Telehealth: Payer: Self-pay | Admitting: *Deleted

## 2021-01-29 DIAGNOSIS — R911 Solitary pulmonary nodule: Secondary | ICD-10-CM

## 2021-01-29 DIAGNOSIS — J61 Pneumoconiosis due to asbestos and other mineral fibers: Secondary | ICD-10-CM

## 2021-01-29 NOTE — Telephone Encounter (Signed)
Called pt to remind him CT due and will place order after I talk with him  LMTCB

## 2021-01-29 NOTE — Telephone Encounter (Signed)
-----   Message from Nyoka Cowden, MD sent at 02/06/2020  1:00 PM EST ----- Ct chest s contrast due f/u lung nodules/ asbestos exposure

## 2021-01-30 NOTE — Telephone Encounter (Signed)
Patient came into office sating he missed a call from our staff and wanted to check in. Found this encounter and told patient I thought they were contacting him about setting up a chest CT. He said he would be fine doing that and is going to schedule a follow up visit with Tonya at the front while he is here. Nothing further needed. Order placed

## 2021-02-17 ENCOUNTER — Other Ambulatory Visit: Payer: Self-pay

## 2021-02-17 ENCOUNTER — Ambulatory Visit (HOSPITAL_COMMUNITY)
Admission: RE | Admit: 2021-02-17 | Discharge: 2021-02-17 | Disposition: A | Discharge status: Home / Self Care | Payer: Medicare Other | Source: Ambulatory Visit | Attending: Internal Medicine | Admitting: Internal Medicine

## 2021-02-17 DIAGNOSIS — J61 Pneumoconiosis due to asbestos and other mineral fibers: Secondary | ICD-10-CM | POA: Insufficient documentation

## 2021-02-17 DIAGNOSIS — R911 Solitary pulmonary nodule: Secondary | ICD-10-CM | POA: Diagnosis present

## 2021-03-14 ENCOUNTER — Other Ambulatory Visit: Payer: Self-pay

## 2021-03-14 ENCOUNTER — Encounter: Payer: Self-pay | Admitting: Internal Medicine

## 2021-03-14 ENCOUNTER — Ambulatory Visit: Payer: Medicare Other | Admitting: Internal Medicine

## 2021-03-14 DIAGNOSIS — R0609 Other forms of dyspnea: Secondary | ICD-10-CM | POA: Diagnosis not present

## 2021-03-14 DIAGNOSIS — J329 Chronic sinusitis, unspecified: Secondary | ICD-10-CM

## 2021-03-14 DIAGNOSIS — J61 Pneumoconiosis due to asbestos and other mineral fibers: Secondary | ICD-10-CM

## 2021-03-14 MED ORDER — PANTOPRAZOLE SODIUM 40 MG PO TBEC: 40.0000 mg | DELAYED_RELEASE_TABLET | Freq: Every day | ORAL | 2 refills | Status: DC

## 2021-03-14 MED ORDER — FAMOTIDINE 20 MG PO TABS: ORAL_TABLET | ORAL | 11 refills | Status: DC

## 2021-03-14 MED ORDER — CEFDINIR 300 MG PO CAPS: 300.0000 mg | ORAL_CAPSULE | Freq: Two times a day (BID) | ORAL | 0 refills | Status: DC

## 2021-03-14 NOTE — Assessment & Plan Note (Signed)
Clinical dx only 02/15/2020  > try nasacort and f/u with sinus CT vs ent eval vs allergy  - Allergy profile 02/15/2020 >  Eos 0.5 /  IgE  41 - CT sinus 02/23/20 : 2.9 cm soft tissue lesion at the mid-posterior left nasal cavity, which could reflect mucosal hypertrophy or possibly polyp > referred to ENT >>>  03/28/20  Nasal polypectomy by Dr Marene Lenz   He is quite hoarse today with component of Upper airway cough syndrome (previously labeled PNDS),  is so named because it's frequently impossible to sort out how much is  CR/sinusitis with freq throat clearing (which can be related to primary GERD)   vs  causing  secondary (" extra esophageal")  GERD from wide swings in gastric pressure that occur with throat clearing, often  promoting self use of mint and menthol lozenges that reduce the lower esophageal sphincter tone and exacerbate the problem further in a cyclical fashion.   These are the same pts (now being labeled as having "irritable larynx syndrome" by some cough centers) who not infrequently have a history of having failed to tolerate ace inhibitors,  dry powder inhalers or biphosphonates or report having atypical/extraesophageal reflux symptoms that don't respond to standard doses of PPI  and are easily confused as having aecopd or asthma flares by even experienced allergists/ pulmonologists (myself included).   rec omnicef x 10d Max rx for gerd F/u ent p 2 weeks    Each maintenance medication was reviewed in detail including emphasizing most importantly the difference between maintenance and prns and under what circumstances the prns are to be triggered using an action plan format where appropriate.  Total time for H and P, chart review, counseling,  directly observing portions of ambulatory 02 saturation study/ and generating customized AVS unique to this office visit / same day charting > 30 min

## 2021-03-14 NOTE — Progress Notes (Addendum)
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 Fine when resting. Left side of head stays stopped up and drains and makes him have dry cough. Worked in [employer] since 11th grade and was around [employer].  Dyspnea:  Can't do a whole food lion / one flight of steps is difficult / mb is 200 ft flat s stopping with sats never lower than 93%  Cough: hoarse, sensation of throat tickles x one year  On acei no mucus prod Sleep: nose stopped up / on back bed is flat/ one pillow  SABA use:  Albuterol tid not prn - not sure it helps rec Only use your albuterol as a rescue medication  Also ok  Try albuterol 15 min before an activity  Make sure you check your oxygen saturations at highest level of activity to be sure it stays over 90% and keep track of it at least once a week, more often if breathing getting worse, and let me know if losing ground.  Stop lisinopril and start losartan 25 mg one daily instead     02/15/2020  f/u ov/[place] office/Azana Kiesler re: asbestos pleural dz/  Asbestosis  COPD GOLD 0  Essential hypertension . 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 He is using his albuterol inhaler 10 x daily on average.   Dyspnea: much worse x one  week Cough: assoc with brown mucus   no assoc fever,  SABA use: as above / last used it 30 min prior x 2 puffs, had not been using at all prior to a week prior to OV   02: none / sats  Covid status: vax x 3 rec Plan A = Automatic = Always=    Breztri Take 2 puffs first thing in am and then another 2 puffs about 12 hours later.  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 He is using his albuterol inhaler 3 x per wk on average.   Dyspnea:  Walks a quarter mile with dog occ sob/ mostly just has doe when "toting wood"  Cough: none  Sleeping: fine  SABA use: really only once or twice a week 02: none Covid status: vax x 3 Lung cancer screening: n/a Rec Ok to Try albuterol 15 min before an activity that you know would make you short of breath and see if it makes any difference  Try to wean the Breztri a week a time 1) first try one twice daily  X one week 2) One each am x 1 week  Then stop  Make sure you check  your oxygen saturation at your highest level of activity          03/14/2021  f/u ov/[place] office/Americo Vallery re: GOLD 0 COPD/ asbestos pleural dz  maint on no rx  x one month no change on vs off breztri  Chief Complaint  Patient presents with   Follow-up    Feels cough breathing and wheezing have worsened since last ov.    Dyspnea:  50 ft and sats drop to 86% x sev months  Cough: 2-3 x per day/ slt brownish x sev months  Sleeping: worse orthopnea x sev months  SABA use: not using doesn't help  02: none  Covid status: vax x 4  no infection  Overt hb      No obvious day to day or daytime variability or assoc excess/ purulent sputum or mucus plugs or hemoptysis or cp or chest tightness, subjective wheeze .  Sleeping as above  without nocturnal  or early am exacerbation  of respiratory  c/o's or need for noct saba. Also denies any obvious fluctuation of symptoms with weather or environmental changes or other aggravating or alleviating factors except as outlined above   No unusual exposure hx or h/o childhood pna/ asthma or knowledge of premature birth.  Current Allergies, Complete Past Medical History, Past Surgical History, Family History, and Social History were reviewed in [facility] record.  ROS  The following are not active complaints unless bolded Hoarseness, sore throat, dysphagia, dental problems, itching, sneezing,  nasal congestion or discharge of excess mucus or purulent secretions, ear ache,   fever, chills, sweats, unintended wt loss or wt gain, classically pleuritic or exertional cp,  orthopnea pnd or arm/hand swelling  or leg swelling, presyncope, palpitations, abdominal pain, anorexia, nausea, vomiting, diarrhea  or change in bowel habits or change in bladder habits, change in stools or change in urine, dysuria, hematuria,  rash, arthralgias, visual complaints, headache, numbness, weakness or ataxia or problems with walking or coordination,  change  in mood or  memory. Tremor R > L        Current Meds  Medication Sig   albuterol (VENTOLIN HFA) 108 (90 Base) MCG/ACT inhaler Inhale 2 puffs into the lungs every 4 (four) hours as needed for wheezing or shortness of breath (cough, shortness of breath or wheezing.).   Ascorbic Acid (VITAMIN C) 100 MG tablet Take 100 mg by mouth daily.   atorvastatin (LIPITOR) 20  MG tablet Take 20 mg by mouth daily.   B Complex Vitamins (B COMPLEX 100 PO) Take 1 tablet by mouth daily.        Cholecalciferol (CVS VIT D 5000 HIGH-POTENCY PO) Take 1 tablet by mouth daily.   coenzyme Q10-levOCARNitine (CO Q-10 PLUS) 100-20 MG CAPS Take 200 mg by mouth daily.   losartan (COZAAR) 25 MG tablet Take 1 tablet (25 mg total) by mouth daily.   Magnesium 400 MG CAPS Take by mouth.   metFORMIN (GLUCOPHAGE) 500 MG tablet Take 500 mg by mouth 2 (two) times daily.   PARoxetine (PAXIL) 20 MG tablet Take 20 mg by mouth daily.   triamcinolone (NASACORT) 55 MCG/ACT AERO nasal inhaler Place 2 sprays into the nose daily.   VITAMIN A PO Take 1 tablet by mouth daily.   vitamin B-12 (CYANOCOBALAMIN) 500 MCG tablet Take 500 mcg by mouth daily.   vitamin E 180 MG (400 UNITS) capsule Take 400 Units by mouth daily.   Current Facility-Administered Medications for the 03/14/21 encounter (Office Visit) with Nyoka Cowden, MD  Medication   methylPREDNISolone acetate (DEPO-MEDROL) injection 120 mg                Past Medical History:  Diagnosis Date   Asbestos exposure    Diabetes mellitus without complication (HCC)    Emphysema lung (HCC)      Objective:    03/14/2021     189  07/15/2020         193 06/18/2020         191  02/15/20 189 lb (85.7 kg)  11/10/19 185 lb (83.9 kg)  10/05/19 200 lb (90.7 kg)    Vital signs reviewed  03/14/2021  - Note at rest 02 sats  91% on RA   General appearance:    hoarse wm mild pseudowheeze    HEENT : pt wearing mask not removed for exam due to covid - 19 concerns.   NECK :  without  JVD/Nodes/TM/ nl carotid upstrokes bilaterally   LUNGS: no acc muscle use,  Min barrel  contour chest wall with bilateral  slightly decreased bs s audible wheeze and  without cough on insp or exp maneuvers and min  Hyperresonant  to  percussion bilaterally     CV:  RRR  no s3 or murmur or increase in P2, and no edema   ABD:  soft and nontender with pos end  insp Hoover's  in the supine position. No bruits or organomegaly appreciated, bowel sounds nl  MS:   Nl gait/  ext warm without deformities, calf tenderness, cyanosis or clubbing No obvious joint restrictions   SKIN: warm and dry without lesions    NEURO:  alert, approp, nl sensorium with  no motor or cerebellar deficits apparent.         I personally reviewed images and agree with radiology impression as follows:   Chest CT 02/17/21 s contrast  1. Asbestos related pleural disease. Thin rind of loculated pleural fluid bilaterally, similar on the right and new on the left. Associated basilar predominant parenchymal banding and volume loss, progressive from 04/27/2019. Findings may be due to asbestosis. If further evaluation is desired, high-resolution chest CT without contrast is recommended in future evaluation. 2. Pulmonary nodules are unchanged from 04/27/2019 and considered benign. 3. Cirrhosis. 4. Aortic atherosclerosis (ICD10-I70.0). Coronary artery calcification. 5.  Emphysema (ICD10-J43.9).    Labs done 03/14/2021  Lab Results  Component Value Date   WBC 7.6 03/14/2021  HGB 11.7 (L) 03/14/2021   HCT 36.6 (L) 03/14/2021   MCV 83 03/14/2021   PLT 384 03/14/2021      Lab Results  Component Value Date   ESRSEDRATE 39 (H) 03/14/2021   ESRSEDRATE 67 (H) 02/15/2020     BNP  03/14/2021     115     Assessment

## 2021-03-14 NOTE — Assessment & Plan Note (Signed)
Long h/o exposure hx including working for [employer] in [place] in the ? 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 No definitive imaging findings to suggest asbestosis at this time. 2. Small pulmonary nodules scattered throughout the lungs bilaterally, stable compared to prior examinations, largest of which has a mean diameter of 8 mm in the left lower lobe. Non-contrast chest CT at 12 months is recommended> placed in reminder file for 02/04/21 CT 02/17/21  1. Asbestos related pleural disease. Thin rind of loculated pleural fluid bilaterally, similar on the right and new on the left. Associated basilar predominant parenchymal banding and volume loss, progressive from 04/27/2019. Findings may be due to asbestosis. If further evaluation is desired, high-resolution chest CT without contrast is recommended in future evaluation. 2. Pulmonary nodules are unchanged from 04/27/2019 and considered benign. 3. Cirrhosis.  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

## 2021-03-14 NOTE — Assessment & Plan Note (Signed)
03/14/2021   Walked on RA  x  3  lap(s) =  approx 450  ft  @ mod pace, stopped due to end of study by sob on 2nd lap with lowest 02 sats 92%  No need for amb 02 at this point  Again advised: Make sure you check your oxygen saturation at your highest level of activity to be sure it stays over 90% and keep track of it at least once a week, more often if breathing getting worse, and let me know if losing ground.

## 2021-03-14 NOTE — Patient Instructions (Addendum)
Omnicef 300 mg twice daily x 10 days   Goal is to keep your 0xygen saturation above 90%   Pantoprazole (protonix) 40 mg   Take  30-60 min before first meal of the day and Pepcid (famotidine)  20 mg after supper until return to office - this is the best way to tell whether stomach acid is contributing to your problem.    GERD (REFLUX)  is an extremely common cause of respiratory symptoms just like yours , many times with no obvious heartburn at all.    It can be treated with medication, but also with lifestyle changes including elevation of the head of your bed (ideally with 6 -8inch blocks under the headboard of your bed),  Smoking cessation, avoidance of late meals, excessive alcohol, and avoid fatty foods, chocolate, peppermint, colas, red wine, and acidic juices such as orange juice.  NO MINT OR MENTHOL PRODUCTS SO NO COUGH DROPS  USE SUGARLESS CANDY INSTEAD (Jolley ranchers or Stover's or Life Savers) or even ice chips will also do - the key is to swallow to prevent all throat clearing. NO OIL BASED VITAMINS - use powdered substitutes.  Avoid fish oil when coughing.   Please remember to go to the lab department   for your tests - we will call you with the results when they are available.  We have requested  your labs from your PCP   Please see your ENT doctor about your nasal symptoms and hoarseness after about 2 weeks (to see what good the antibiotics do)    Please schedule a follow up office visit in 6 weeks, call sooner if needed with chest x ray on return

## 2021-03-15 LAB — CBC WITH DIFFERENTIAL/PLATELET
Basophils Absolute: 0 10*3/uL (ref 0.0–0.2)
Basos: 0 %
EOS (ABSOLUTE): 0.4 10*3/uL (ref 0.0–0.4)
Eos: 5 %
Hematocrit: 36.6 % — ABNORMAL LOW (ref 37.5–51.0)
Hemoglobin: 11.7 g/dL — ABNORMAL LOW (ref 13.0–17.7)
Immature Grans (Abs): 0 10*3/uL (ref 0.0–0.1)
Immature Granulocytes: 0 %
Lymphocytes Absolute: 1.1 10*3/uL (ref 0.7–3.1)
Lymphs: 14 %
MCH: 26.4 pg — ABNORMAL LOW (ref 26.6–33.0)
MCHC: 32 g/dL (ref 31.5–35.7)
MCV: 83 fL (ref 79–97)
Monocytes Absolute: 0.8 10*3/uL (ref 0.1–0.9)
Monocytes: 10 %
Neutrophils Absolute: 5.3 10*3/uL (ref 1.4–7.0)
Neutrophils: 71 %
Platelets: 384 10*3/uL (ref 150–450)
RBC: 4.43 x10E6/uL (ref 4.14–5.80)
RDW: 14.4 % (ref 11.6–15.4)
WBC: 7.6 10*3/uL (ref 3.4–10.8)

## 2021-03-15 LAB — SEDIMENTATION RATE: Sed Rate: 39 mm/hr — ABNORMAL HIGH (ref 0–30)

## 2021-03-15 LAB — BRAIN NATRIURETIC PEPTIDE: BNP: 114.9 pg/mL — ABNORMAL HIGH (ref 0.0–100.0)

## 2021-04-17 ENCOUNTER — Telehealth: Payer: Self-pay | Admitting: Internal Medicine

## 2021-04-17 ENCOUNTER — Other Ambulatory Visit: Payer: Self-pay

## 2021-04-17 DIAGNOSIS — R0609 Other forms of dyspnea: Secondary | ICD-10-CM

## 2021-04-17 NOTE — Telephone Encounter (Signed)
Order placed. Nothing further needed. 

## 2021-04-29 ENCOUNTER — Ambulatory Visit (HOSPITAL_COMMUNITY)
Admission: RE | Admit: 2021-04-29 | Discharge: 2021-04-29 | Disposition: A | Discharge status: Home / Self Care | Payer: Medicare Other | Source: Ambulatory Visit | Attending: Internal Medicine | Admitting: Internal Medicine

## 2021-04-29 ENCOUNTER — Ambulatory Visit: Payer: Medicare Other | Admitting: Internal Medicine

## 2021-04-29 ENCOUNTER — Other Ambulatory Visit: Payer: Self-pay

## 2021-04-29 ENCOUNTER — Encounter: Payer: Self-pay | Admitting: Internal Medicine

## 2021-04-29 DIAGNOSIS — R0609 Other forms of dyspnea: Secondary | ICD-10-CM

## 2021-04-29 DIAGNOSIS — J61 Pneumoconiosis due to asbestos and other mineral fibers: Secondary | ICD-10-CM | POA: Diagnosis present

## 2021-04-29 DIAGNOSIS — E119 Type 2 diabetes mellitus without complications: Secondary | ICD-10-CM | POA: Diagnosis not present

## 2021-04-29 DIAGNOSIS — J449 Chronic obstructive pulmonary disease, unspecified: Secondary | ICD-10-CM | POA: Diagnosis not present

## 2021-04-29 NOTE — Assessment & Plan Note (Signed)
Quit smoking 1980  - Emphysema on CT chest 04/27/19 - PFT's  02/06/2020  FEV1 1.65 (55 % ) ratio 0.71  p 4 % improvement from saba p 0 prior to study with DLCO  15.17 (60%) corrects to 3.70 (95%)  for alv volume and FV curve mild concavity  - Labs ordered 02/15/2020  :     alpha one AT phenotype   (since has emphysema on ct)   MM  Level 84 - 06/18/2020    try breztri 2bid x 2 weeks samples  > ? Better 07/15/2020   - 07/15/2020  After extensive coaching inhaler device,  effectiveness =    90% > try to wean breztri (hoarseness and not sure it really helped)  -  07/15/2020   Walked RA  approx   500 ft  @ fast pace  stopped due to  End of study, very min  sob, sats 93%    Actually some better off breztri so reserve bronchodilators ro prn flare eg with viral bronchitis

## 2021-04-29 NOTE — Progress Notes (Signed)
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 Fine when resting. Left side of head stays stopped up and drains and makes him have dry cough. Worked in [employer] since 11th grade and was around [employer].  Dyspnea:  Can't do a whole [employer] lion / one flight of steps is difficult / mb is 200 ft flat s stopping with sats never lower than 93%  Cough: hoarse, sensation of throat tickles x one year  On acei no mucus prod Sleep: nose stopped up / on back bed is flat/ one pillow  SABA use:  Albuterol tid not prn - not sure it helps rec Only use your albuterol as a rescue medication  Also ok  Try albuterol 15 min before an activity  Make sure you check your oxygen saturations at highest level of activity to be sure it stays over 90% and keep track of it at least once a week, more often if breathing getting worse, and let me know if losing ground.  Stop lisinopril and start losartan 25 mg one daily instead     02/15/2020  f/u ov/[facility]ic [name] office/Dominic [name] [name]: asbestos pleural dz/  Asbestosis  COPD GOLD 0  Essential hypertension . 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 He is using his albuterol inhaler 10 x daily on average.   Dyspnea: much worse x one  week Cough: assoc with brown mucus   no assoc fever,  SABA use: as above / last used it 30 min prior x 2 puffs, had not been using at all prior to a week prior to OV   02: none / sats  Covid status: vax x 3 rec Plan A = Automatic = Always=    Breztri Take 2 puffs first thing in am and then another 2 puffs about 12 hours later.  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 He is using his albuterol inhaler 3 x per wk on average.   Dyspnea:  Walks a quarter mile with dog occ sob/ mostly just has doe when "toting wood"  Cough: none  Sleeping: fine  SABA use: really only once or twice a week 02: none Covid status: vax x 3 Lung cancer screening: n/a Rec Ok to Try albuterol 15 min before an activity that you know would make you short of breath and see if it makes any difference  Try to wean the Breztri a week a time 1) first try one twice daily  X one week 2) One each am x 1 week  Then stop  Make sure you check  your oxygen saturation at your highest level of activity          03/14/2021  f/u ov/[place] office/Dominic [name] [name]: GOLD 0 COPD/ asbestos pleural dz  maint on no rx  x one month no change on vs off breztri  Chief Complaint  Patient presents with   Follow-up    Feels cough breathing and wheezing have worsened since last ov.    Dyspnea:  50 ft and sats drop to 86% x sev months  Cough: 2-3 x per day/ slt brownish x sev months  Sleeping: worse orthopnea x sev months  SABA use: not using doesn't help  02: none  Covid status: vax x 4  no infection  Overt hb  Rec Omnicef 300 mg twice daily x 10 days  Goal is to keep your 0xygen saturation above 90%  Pantoprazole (protonix) 40 mg   Take  30-60 min before first meal of the day and Pepcid (famotidine)  20 mg after supper until return to office GERD diet reviewed, bed blocks rec  Please see your ENT doctor about your nasal symptoms and hoarseness after about 2 weeks (to see what good the antibiotics do)  Please schedule a follow up office visit in 6 weeks, call sooner if needed with chest x ray on return      Emt 03/26/21 > rec bud/ atrovent  and continue reflux   04/29/2021  f/u ov/[place] office/Dominic [name] [name]: asbestosis/ doe/ sinusitis maint on gerd rx     Chief Complaint  Patient presents with   Follow-up    Cough and wheezing have improved since last OV. Breathing is about the same.   Dyspnea:  can now do food lion leaning on cart / no 02 / does have HC parking  Cough: better, still hoarse and clearing throat. - ice/ hard rock candy helping Sleeping: hob up 6 in/ /one big and no resp cc  SABA use: none  02: none  Covid status: vax x 4      No obvious day to day or daytime variability or assoc excess/ purulent sputum or mucus plugs or hemoptysis or cp or chest tightness, subjective wheeze or overt sinus or hb symptoms.   Sleeping as above without nocturnal  or early am exacerbation  of respiratory  c/o's or need for noct saba. Also  denies any obvious fluctuation of symptoms with weather or environmental changes or other aggravating or alleviating factors except  as outlined above   No unusual exposure hx or h/o childhood pna/ asthma or knowledge of premature birth.  Current Allergies, Complete Past Medical History, Past Surgical History, Family History, and Social History were reviewed in [facility] record.  ROS  The following are not active complaints unless bolded Hoarseness, sore throat, dysphagia, dental problems, itching, sneezing,  nasal congestion or discharge of excess mucus or purulent secretions, ear ache,   fever, chills, sweats, unintended wt loss or wt gain, classically pleuritic or exertional cp,  orthopnea pnd or arm/hand swelling  or leg swelling, presyncope, palpitations, abdominal pain, anorexia, nausea, vomiting, diarrhea  or change in bowel habits or change in bladder habits, change in stools or change in urine, dysuria, hematuria,  rash, arthralgias, visual complaints, headache, numbness, weakness or ataxia or problems with walking or coordination,  change in mood or  memory.        Current Meds  Medication Sig   Ascorbic Acid (VITAMIN C) 100 MG tablet Take 100 mg by mouth daily.   atorvastatin (LIPITOR) 20 MG tablet Take 20 mg by mouth daily.   B Complex Vitamins (B COMPLEX 100 PO) Take 1 tablet by mouth daily.   cefdinir (OMNICEF) 300 MG capsule Take 1 capsule (300 mg total) by mouth 2 (two) times daily.   Cholecalciferol (CVS VIT D 5000 HIGH-POTENCY PO) Take 1 tablet by mouth daily.   coenzyme Q10-levOCARNitine (CO Q-10 PLUS) 100-20 MG CAPS Take 200 mg by mouth daily.   famotidine (PEPCID) 20 MG tablet One after supper   losartan (COZAAR) 25 MG tablet Take 1 tablet (25 mg total) by mouth daily.   Magnesium 400 MG CAPS Take by mouth.   metFORMIN (GLUCOPHAGE) 500 MG tablet Take 500 mg by mouth 2 (two) times daily.   pantoprazole (PROTONIX) 40 MG tablet Take 1 tablet (40 mg  total) by mouth daily. Take 30-60 min before first meal of the day   PARoxetine (PAXIL) 20 MG tablet Take 20 mg by mouth daily.   triamcinolone (NASACORT) 55 MCG/ACT AERO nasal inhaler Place 2 sprays into the nose daily.   VITAMIN A PO Take 1 tablet by mouth daily.   vitamin B-12 (CYANOCOBALAMIN) 500 MCG tablet Take 500 mcg by mouth daily.   vitamin E 180 MG (400 UNITS) capsule Take 400 Units by mouth daily.             Past Medical History:  Diagnosis Date   Asbestos exposure    Diabetes mellitus without complication (Uncertain)    Emphysema lung (HCC)      Objective:    Wt  04/29/2021       178 03/14/2021     189  07/15/2020         193 06/18/2020         191  02/15/20 189 lb (85.7 kg)  11/10/19 185 lb (83.9 kg)  10/05/19 200 lb (90.7 kg)    Vital signs reviewed  04/29/2021  - Note at rest 02 sats  96% on RA   General appearance:    pleasant mod hoarse amb wm, occ throat clearing     HEENT : pt wearing mask not removed for exam due to covid -19 concerns.    NECK :  without JVD/Nodes/TM/ nl carotid upstrokes bilaterally   LUNGS: no acc muscle use,  Nl contour chest  with coarse insp crackles bilaterally without cough on insp maneuvers   CV:  RRR  no s3 or murmur or increase  in P2, and no edema   ABD:  soft and nontender with nl inspiratory excursion in the supine position. No bruits or organomegaly appreciated, bowel sounds nl  MS:  Nl gait/ ext warm without deformities, calf tenderness, cyanosis   - No obvious joint restrictions - NO clubbing   SKIN: warm and dry without lesions    NEURO:  alert, approp, nl sensorium with  no motor or cerebellar deficits apparent.     CXR PA and Lateral:   04/29/2021 :    I personally reviewed images and agree with radiology impression as follows:     Increasing left-sided pleural fluid/thickening, presumably secondary to the patient's known asbestos related pleural disease. Similar appearance of chronic pleural and  interstitial/airspace disease of the right chest.    Assessment

## 2021-04-29 NOTE — Patient Instructions (Addendum)
Check your blood sugar 1st thing in am and if less than 100 begin to wean down your glucophage to see if your weight stabilizes and whether you really need glucophage at present dose.   You should call Sheria Lang re asbestosis  [phone]  - let her office know I sent you   Please remember to go to the  x-ray department  @  [facility] for your tests - we will call you with the results when they are available       Please schedule a follow up visit in 3 months but call sooner if needed

## 2021-04-29 NOTE — Assessment & Plan Note (Signed)
Onset Sept 2020  03/14/2021   Walked on RA  x  3  lap(s) =  approx 450  ft  @ mod pace, stopped due to end of study by sob on 2nd lap with lowest 02 sats 92%  - 04/29/2021  Patient walked at a moderate pace on room air x 3 laps 150 ft each.  Reported SOB on 2nd and 3rd laps with lowest sats 93%   F/u q 3 m, in meantime rec Make sure you check your oxygen saturation at your highest level of activity to be sure it stays over 90% and keep track of it at least once a week, more often if breathing getting worse, and let me know if losing ground.

## 2021-04-30 ENCOUNTER — Encounter: Payer: Self-pay | Admitting: Internal Medicine

## 2021-04-30 ENCOUNTER — Other Ambulatory Visit: Payer: Self-pay

## 2021-04-30 DIAGNOSIS — R0609 Other forms of dyspnea: Secondary | ICD-10-CM

## 2021-04-30 NOTE — Assessment & Plan Note (Signed)
With wt loss advised to decrease glucophage as long as FBS remains well controlled

## 2021-04-30 NOTE — Assessment & Plan Note (Addendum)
Long h/o exposure hx including working for [employer] in [place] in the ? 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 No definitive imaging findings to suggest asbestosis at this time. 2. Small pulmonary nodules scattered throughout the lungs bilaterally, stable compared to prior examinations, largest of which has a mean diameter of 8 mm in the left lower lobe. Non-contrast chest CT at 12 months is recommended> placed in reminder file for 02/04/21 CT 02/17/21  1. Asbestos related pleural disease. Thin rind of loculated pleural fluid bilaterally, similar on the right and new on the left. Associated basilar predominant parenchymal banding and volume loss, progressive from 04/27/2019. Findings may be due to asbestosis. If further evaluation is desired, high-resolution chest CT without contrast is recommended in future evaluation. 2. Pulmonary nodules are unchanged from 04/27/2019 and considered benign. 3. Cirrhosis.  Progressive pleuroparenchymal dz c/w asbestosis and likely benign effusions / thickening > at this point really not enough to tap but definitely need to be monitored with mesolthelioma in the ddx   Rec f/u q 3 months - u/s guided t centesis if symptomatic worsening assoc with gross evidnce of cxr of tapable effusion.   Discussed in detail all the  indications, usual  risks and alternatives  relative to the benefits with patient who agrees to proceed with w/u as outlined.     Also referred for asbestosis  compensation                 Each maintenance medication was reviewed in detail including emphasizing most importantly the difference between maintenance and prns and under what circumstances the prns are to be triggered using an action plan format where appropriate.  Total time for H and P, chart review, counseling, reviewing hfa device(s) and  generating customized AVS unique to this office visit / same day charting = 39 min

## 2021-06-09 ENCOUNTER — Other Ambulatory Visit: Payer: Self-pay | Admitting: Internal Medicine

## 2021-07-29 ENCOUNTER — Encounter: Payer: Self-pay | Admitting: Internal Medicine

## 2021-07-29 ENCOUNTER — Ambulatory Visit (HOSPITAL_COMMUNITY)
Admission: RE | Admit: 2021-07-29 | Discharge: 2021-07-29 | Disposition: A | Discharge status: Home / Self Care | Payer: Medicare Other | Source: Ambulatory Visit | Attending: Internal Medicine | Admitting: Internal Medicine

## 2021-07-29 ENCOUNTER — Ambulatory Visit: Payer: Medicare Other | Admitting: Internal Medicine

## 2021-07-29 DIAGNOSIS — J61 Pneumoconiosis due to asbestos and other mineral fibers: Secondary | ICD-10-CM

## 2021-07-29 DIAGNOSIS — J329 Chronic sinusitis, unspecified: Secondary | ICD-10-CM

## 2021-07-29 DIAGNOSIS — R0609 Other forms of dyspnea: Secondary | ICD-10-CM | POA: Insufficient documentation

## 2021-07-29 DIAGNOSIS — J449 Chronic obstructive pulmonary disease, unspecified: Secondary | ICD-10-CM | POA: Diagnosis not present

## 2021-07-29 MED ORDER — FAMOTIDINE 20 MG PO TABS: ORAL_TABLET | ORAL | 11 refills | Status: DC

## 2021-07-29 MED ORDER — PANTOPRAZOLE SODIUM 40 MG PO TBEC: DELAYED_RELEASE_TABLET | ORAL | 2 refills | Status: DC

## 2021-07-29 NOTE — Patient Instructions (Addendum)
Resume Pantoprazole (protonix) 40 mg  Take  30-60 min before first meal of the day and Pepcid (famotidine)  20 mg after supper until return to office - this is the best way to tell whether stomach acid is contributing to your problem.  ? ?Make sure you check your oxygen saturation at your highest level of activity to be sure it stays over 90% and keep track of it at least once a week, more often if breathing getting worse, and let me know if losing ground.  ? ?Please remember to go to the  x-ray department  @  [facility] for your tests - we will call you with the results when they are available    ? ?Please schedule a follow up visit in 3 months but call sooner if needed  ? ? ? ?

## 2021-07-29 NOTE — Progress Notes (Signed)
? ?Yitzhak Bryers, male    DOB: 02/27/1941 MRN: [id number] ? ? 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 ?  ?History of Present Illness  ?11/10/2019  Pulmonary/ 1st office eval/ [name] / [place] [facility] asbestosis ?Chief Complaint  ?Patient presents with  ? Consult  ?  Shortness of breath with exertion. Fine when resting. Left side of head stays stopped up and drains and makes him have dry cough. Worked in [employer] since 11th grade and was around [employer].  ?Dyspnea:  Can't do a whole [employer] / one flight of steps is difficult / mb is 200 ft flat s stopping with sats never lower than 93%  ?Cough: hoarse, sensation of throat tickles x one year  On acei no mucus prod ?Sleep: nose stopped up / on back bed is flat/ one pillow  ?SABA use:  Albuterol tid not prn - not sure it helps ?rec ?Only use your albuterol as a rescue medication  ?Also ok  Try albuterol 15 min before an activity  ?Make sure you check your oxygen saturations at highest level of activity to be sure it stays over 90% and keep track of it at least once a week, more often if breathing getting worse, and let me know if losing ground.  ?Stop lisinopril and start losartan 25 mg one daily instead ?  ? ? ?02/15/2020  f/u ov/[place] office/Dallis Czaja re: asbestos pleural dz/  ?Asbestosis  ?COPD GOLD 0  ?Essential hypertension . ?Dyspnea:   Sob back to house from mb is about 500 ft flat  About 50% improved  ?Cough: same  nose still gets clogged up at hs and all day long ?Sleeping: bed flat/ does better on back  ?SABA use: only uses before ex and it doesn't help ?02: none / no desats  ?rec ?Nasacort 1-2 hours each nostril point it  toward ear on same side ? emphasized that nasal steroids (nasacort)  have no immediate benefit i ?Depomedrol 120 mg IM  ?Only use your albuterol as a rescue medication ?  ? ? ?03/28/20  Nasal polypectomy by Dr Fredric Dine  ? ? ?06/18/2020  f/u ov/[place] office/Suttyn Cryder re: GOLD 0 copd /rhinitis plus asbestos pleural dz  ?Chief Complaint  ?Patient presents with  ? Acute Visit  ?  Increased SOB, cough with minimal brown sputum, wheezing for approx 1 wk. He is using his albuterol inhaler 10 x daily on average.   ?Dyspnea: much worse x one  week ?Cough: assoc with brown mucus  ? no assoc fever,  ?SABA use: as above / last used it 30 min prior x 2 puffs, had not been using at all prior to a week prior to OV   ?02: none / sats  ?Covid status: vax x 3 ?rec ?Plan A = Automatic = Always=    Breztri Take 2 puffs first thing in am and then another 2 puffs about 12 hours later.  ?Work on inhaler technique:    ?depomedrol 120 mg IM  ?Plan B = Backup (to supplement plan A, not to replace it) ?Only use your albuterol inhaler as a rescue medication  ?zpak should turn mucus  clear ?For cough / congestion >  mucinex dm 1200 mg every 12 hours as needed  ?  ? ?  ? ? ?07/15/2020  f/u ov/[place] office/Eden Toohey re: GOLD 0 copd/ asbestos pleural plaques/ rhinitis  ?Chief Complaint  ?Patient presents with  ? Follow-up  ?  Breathing has improved since the last visit and no more coughing or wheezing. He is using his albuterol inhaler 3 x per wk on average.   ?Dyspnea:  Walks a quarter mile with dog occ sob/ mostly just has doe when "toting wood"  ?Cough: none  ?Sleeping: fine  ?SABA use: really only once or twice a week ?02: none ?Covid status: vax x 3 ?Lung cancer screening: n/a ?Rec ?Ok to Try albuterol 15 min before an activity that you know would make you short of breath and see if it makes any difference  ?Try to wean the Judithann Sauger a week a time ?1) first try one twice daily  X one week ?2) One each am x 1 week  Then stop  ?Make sure you check  your oxygen saturation at your highest level of activity  ?  ? ? ? ? ? ? ?03/14/2021  f/u ov/[place] office/Rhodie Cienfuegos re: GOLD 0 COPD/ asbestos pleural dz  maint on no rx  x one month no change on vs off breztri  ?Chief Complaint  ?Patient presents with  ? Follow-up  ?  Feels cough breathing and wheezing have worsened since last ov.   ? Dyspnea:  50 ft and sats drop to 86% x sev months  ?Cough: 2-3 x per day/ slt brownish x sev months  ?Sleeping: worse orthopnea x sev months  ?SABA use: not using doesn't help  ?02: none  ?Covid status: vax x 4  no infection  ?Overt hb  ?Rec ?Omnicef 300 mg twice daily x 10 days  ?Goal is to keep your 0xygen saturation above 90%  ?Pantoprazole (protonix) 40 mg   Take  30-60 min before first meal of the day and Pepcid (famotidine)  20 mg after supper until return to office ?GERD diet reviewed, bed blocks rec  ?Please see your ENT doctor about your nasal symptoms and hoarseness after about 2 weeks (to see what good the antibiotics do)  ?Please schedule a follow up office visit in 6 weeks, call sooner if needed with chest x ray on return  ?    ?Ent 03/26/21 > rec bud/ atrovent  and continue reflux ? ? ?04/29/2021  f/u ov/[place] office/Byron Peacock re: asbestosis/ doe/ sinusitis maint on gerd rx     ?Chief Complaint  ?Patient presents with  ? Follow-up  ?  Cough and wheezing have improved since last OV. Breathing is about the same.   ?Dyspnea:  can now do [employer] leaning on cart / no 02 / does have [place] parking  ?Cough: better, still hoarse and clearing throat. - ice/ hard rock candy helping ?Sleeping: hob up 6 in/ /one big and no resp cc  ?SABA use: none  ?02: none  ?Covid status: vax x 4   ?Rec ?Check your blood sugar 1st thing in am and if less than 100 begin to wean down your glucophage to see if your weight stabilizes and whether you really need glucophage at present dose.  ?You should call Bonner Puna re asbestosis  [phone]  - let her office know I sent you ? Please schedule a follow  up visit in 3 months but call sooner if needed  ? ? ? ?07/29/2021  f/u ov/[place] office/Shakeem Stern re: asbestosis  / no longer on gerd rx  ?Chief Complaint  ?Patient presents with  ? Follow-up  ?  Breathing and coughing are about the same   ?Dyspnea:  walking up 30 minutes, variably stops @ slow pace esp hills difficult ?Cough: dry / slt hoarse > eval by ENT c/w gerd  ?Sleeping: hob up / no resp cc  ?SABA use: none  ?02: none  ?Recurrent R pain in area of prev kidney stones x sev weeks prior to OV  - not pleuritic and not worse supine  ? ? ?No obvious day to day or daytime variability or assoc excess/ purulent sputum or mucus plugs or hemoptysis or  chest tightness, subjective wheeze or overt sinus or hb symptoms.  ? ?sleeping without nocturnal  or early am exacerbation  of respiratory  c/o's or need for noct saba. Also denies any obvious fluctuation of symptoms with weather or environmental changes or other aggravating or alleviating factors except as outlined above  ? ?No unusual exposure hx or h/o childhood pna/ asthma or knowledge of premature birth. ? ?Current Allergies, Complete Past Medical History, Past Surgical History, Family History, and Social History were reviewed in [facility] [employer] record. ? ?ROS  The following are not active complaints unless bolded ?Hoarseness, sore throat, dysphagia, dental problems, itching, sneezing,  nasal congestion or discharge of excess mucus or purulent secretions, ear ache,   fever, chills, sweats, unintended wt loss or wt gain, classically pleuritic or exertional cp,  orthopnea pnd or arm/hand swelling  or leg swelling, presyncope, palpitations, abdominal pain, anorexia, nausea, vomiting, diarrhea  or change in bowel habits or change in bladder habits, change in stools or change in urine, dysuria, hematuria,  rash, arthralgias, visual complaints, headache, numbness, weakness or ataxia or problems with walking or coordination,  change in mood or  memory. ?       ? ?Current Meds  ?Medication Sig  ? Ascorbic Acid (VITAMIN C) 100 MG tablet Take 100 mg by mouth daily.  ? B Complex Vitamins (B COMPLEX 100 PO) Take 1 tablet by mouth daily.  ? cefdinir (OMNICEF) 300

## 2021-07-30 ENCOUNTER — Encounter: Payer: Self-pay | Admitting: Internal Medicine

## 2021-07-30 NOTE — Assessment & Plan Note (Signed)
Clinical dx only 02/15/2020  > try nasacort and f/u with sinus CT vs ent eval vs allergy  ?- Allergy profile 02/15/2020 >  Eos 0.5 /  IgE  41 ?- CT sinus 02/23/20 : 2.9 cm soft tissue lesion at the mid-posterior left nasal cavity, ?which could reflect mucosal hypertrophy or possibly polyp > referred to ENT >>>  03/28/20  Nasal polypectomy by Dr Marene Lenz  ?  ?Ent 03/26/21 > rec bud/ atrovent  and continue reflux ? ?rec add back gerd rx/ diet/ reviewed ? ?Each maintenance medication was reviewed in detail including emphasizing most importantly the difference between maintenance and prns and under what circumstances the prns are to be triggered using an action plan format where appropriate. ? ?Total time for H and P, chart review, counseling,  directly observing portions of ambulatory 02 saturation study/ and generating customized AVS unique to this office visit / same day charting  > 30 min ?     ?  ?       ?

## 2021-07-30 NOTE — Assessment & Plan Note (Signed)
Long h/o exposure hx including working for [employer] in [place] in the ? 1960s  ?- extensive R > L pleural dz CT chest  2/11 21 and unchanged 09/06/19  ?- CT chest 02/05/20 1. Asbestos related pleural disease, similar to the prior ?examination. No definitive imaging findings to suggest asbestosis at ?this time. ?2. Small pulmonary nodules scattered throughout the lungs ?bilaterally, stable compared to prior examinations, largest of which ?has a mean diameter of 8 mm in the left lower lobe. Non-contrast ?chest CT at 12 months is recommended> placed in reminder file for 02/04/21 ?CT 02/17/21  ?1. Asbestos related pleural disease. Thin rind of loculated pleural ?fluid bilaterally, similar on the right and new on the left. ?Associated basilar predominant parenchymal banding and volume loss, ?progressive from 04/27/2019. Findings may be due to asbestosis. If ?further evaluation is desired, high-resolution chest CT without ?contrast is recommended in future evaluation. ?2. Pulmonary nodules are unchanged from 04/27/2019 and considered ?benign. ?3. Cirrhosis. ?- 07/29/2021   Walked on RA  x  3  lap(s) =  approx 450  ft  @ fast pace, stopped due to end of study s sob  with lowest 02 sats 91%  ? ?No clinical or radiographic evidence of progression of fibrosis or R effusion > f/u in 3 m ? ?

## 2021-07-30 NOTE — Assessment & Plan Note (Signed)
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 Better 07/15/2020   ?- 07/15/2020  After extensive coaching inhaler device,  effectiveness =    90% > try to wean breztri (hoarseness and not sure it really helped)  ?-  07/15/2020   Walked RA  approx   500 ft  @ fast pace  stopped due to  End of study, very min  sob, sats 93%   ? ?As expected, no change off bronchodilators  ?

## 2021-08-16 ENCOUNTER — Other Ambulatory Visit (HOSPITAL_COMMUNITY): Payer: Self-pay | Admitting: Family Medicine

## 2021-08-16 ENCOUNTER — Other Ambulatory Visit: Payer: Self-pay | Admitting: Family Medicine

## 2021-08-16 DIAGNOSIS — K7581 Nonalcoholic steatohepatitis (NASH): Secondary | ICD-10-CM

## 2021-09-03 ENCOUNTER — Ambulatory Visit (HOSPITAL_COMMUNITY): Admission: RE | Admit: 2021-09-03 | Payer: Medicare Other | Source: Ambulatory Visit

## 2021-09-03 ENCOUNTER — Encounter (HOSPITAL_COMMUNITY): Payer: Self-pay

## 2021-09-09 ENCOUNTER — Ambulatory Visit (HOSPITAL_COMMUNITY)
Admission: RE | Admit: 2021-09-09 | Discharge: 2021-09-09 | Disposition: A | Discharge status: Home / Self Care | Payer: Medicare Other | Source: Ambulatory Visit | Attending: Family Medicine | Admitting: Family Medicine

## 2021-09-09 DIAGNOSIS — K7581 Nonalcoholic steatohepatitis (NASH): Secondary | ICD-10-CM | POA: Insufficient documentation

## 2021-09-29 ENCOUNTER — Other Ambulatory Visit: Payer: Self-pay | Admitting: Internal Medicine

## 2021-10-28 ENCOUNTER — Ambulatory Visit: Payer: Medicare Other | Admitting: Internal Medicine

## 2021-10-28 ENCOUNTER — Encounter: Payer: Self-pay | Admitting: Internal Medicine

## 2021-10-28 DIAGNOSIS — J61 Pneumoconiosis due to asbestos and other mineral fibers: Secondary | ICD-10-CM

## 2021-10-28 NOTE — Patient Instructions (Signed)
Pace yourself to where your 02 saturations are staying 90% or higher    Please schedule a follow up visit in 6 months but call sooner if needed

## 2021-10-28 NOTE — Progress Notes (Unsigned)
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 Fine when resting. Left side of head stays stopped up and drains and makes him have dry cough. Worked in [employer] since 11th grade and was around [employer].  Dyspnea:  Can't do a whole [employer] / one flight of steps is difficult / mb is 200 ft flat s stopping with sats never lower than 93%  Cough: hoarse, sensation of throat tickles x one year  On acei no mucus prod Sleep: nose stopped up / on back bed is flat/ one pillow  SABA use:  Albuterol tid not prn - not sure it helps rec Only use your albuterol as a rescue medication  Also ok  Try albuterol 15 min before an activity  Make sure you check your oxygen saturations at highest level of activity to be sure it stays over 90% and keep track of it at least once a week, more often if breathing getting worse, and let me know if losing ground.  Stop lisinopril and start losartan 25 mg one daily instead     02/15/2020  f/u ov/[place] office/Izadora Roehr re: asbestos pleural dz/  Asbestosis  COPD GOLD 0  Essential hypertension . 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 He is using his albuterol inhaler 10 x daily on average.   Dyspnea: much worse x one  week Cough: assoc with brown mucus   no assoc fever,  SABA use: as above / last used it 30 min prior x 2 puffs, had not been using at all prior to a week prior to OV   02: none / sats  Covid status: vax x 3 rec Plan A = Automatic = Always=    Breztri Take 2 puffs first thing in am and then another 2 puffs about 12 hours later.  Work on inhaler technique:    depomedrol 120 mg IM  Plan B = Backup (to supplement plan A, not to replace it) Only use your albuterol inhaler as a rescue medication  zpak should turn mucus  clear For cough / congestion >  mucinex dm 1200 mg every 12 hours as needed         07/15/2020  f/u ov/[place] office/Nora Rooke re: GOLD 0 copd/ asbestos pleural plaques/ rhinitis  Chief Complaint  Patient presents with   Follow-up    Breathing has improved since the last visit and no more coughing or wheezing. He is using his albuterol inhaler 3 x per wk on average.   Dyspnea:  Walks a quarter mile with dog occ sob/ mostly just has doe when "toting wood"  Cough: none  Sleeping: fine  SABA use: really only once or twice a week 02: none Covid status: vax x 3 Lung cancer screening: n/a Rec Ok to Try albuterol 15 min before an activity that you know would make you short of breath and see if it makes any difference  Try to wean the Breztri a week a time 1) first try one twice daily  X one week 2) One each am x 1 week  Then stop  Make sure you check  your oxygen saturation at your highest level of activity          03/14/2021  f/u ov/[place] office/Mry Lamia re: GOLD 0 COPD/ asbestos pleural dz  maint on no rx  x one month no change on vs off breztri  Chief Complaint  Patient presents with   Follow-up    Feels cough breathing and wheezing have worsened since last ov.    Dyspnea:  50 ft and sats drop to 86% x sev months  Cough: 2-3 x per day/ slt brownish x sev months  Sleeping: worse orthopnea x sev months  SABA use: not using doesn't help  02: none  Covid status: vax x 4  no infection  Overt hb  Rec Omnicef 300 mg twice daily x 10 days  Goal is to keep your 0xygen saturation above 90%  Pantoprazole (protonix) 40 mg   Take  30-60 min before first meal of the day and Pepcid (famotidine)  20 mg after supper until return to office GERD diet reviewed, bed blocks rec  Please see your ENT doctor about your nasal symptoms and hoarseness after about 2 weeks (to see what good the antibiotics do)  Please schedule a follow up office visit in 6 weeks, call sooner if needed with chest x ray on return      Ent 03/26/21 > rec bud/ atrovent  and continue reflux   04/29/2021  f/u ov/[place] office/Kalila Adkison re: asbestosis/ doe/ sinusitis maint on gerd rx     Chief Complaint  Patient presents with   Follow-up    Cough and wheezing have improved since last OV. Breathing is about the same.   Dyspnea:  can now do food lion leaning on cart / no 02 / does have HC parking  Cough: better, still hoarse and clearing throat. - ice/ hard rock candy helping Sleeping: hob up 6 in/ /one big and no resp cc  SABA use: none  02: none  Covid status: vax x 4   Rec Check your blood sugar 1st thing in am and if less than 100 begin to wean down your glucophage to see if your weight stabilizes and whether you really need glucophage at present dose.  You should call Sheria Lang re asbestosis  [phone]  - let her office know I sent you  Please schedule a follow  up visit in 3 months but call sooner if needed     07/29/2021  f/u ov/[place] office/Elleah Hemsley re: asbestosis  / no longer on gerd rx  Chief Complaint  Patient presents with   Follow-up    Breathing and coughing are about the same   Dyspnea:  walking up 30 minutes, variably stops @ slow pace esp hills difficult Cough: dry / slt hoarse > eval by ENT c/w gerd  Sleeping: hob up / no resp cc  SABA use: none  02: none  Recurrent R pain in area of prev kidney stones x sev weeks prior to OV  - not pleuritic and not worse supine  Rec Resume Pantoprazole (protonix) 40 mg  Take  30-60 min before first meal of the day and Pepcid (famotidine)  20 mg after supper until return to office  Make sure you check your oxygen saturation at your highest level of activity to be sure it stays over 90%    10/28/2021  f/u ov/[place] office/Tracker Mance re: asbestosis  maint on no rx   Chief Complaint  Patient presents with   Follow-up    Breathing is about the same    Dyspnea:  power walking at mall x 30 min longer distance than baseline and now sats down in 80s toward end but minimal sob  Cough: none  Sleeping: bed blocks sleeps fine  SABA use: none  02: none  Covid status: vax x 4      No obvious day to day or daytime variability or assoc excess/ purulent sputum or mucus plugs or hemoptysis or cp or chest tightness, subjective wheeze or overt sinus or hb symptoms.   Sleeping  without nocturnal  or early am exacerbation  of respiratory  c/o's or need for noct saba. Also denies any obvious fluctuation of symptoms with weather or environmental changes or other aggravating or alleviating factors except as outlined above   No unusual exposure hx or h/o childhood pna/ asthma or knowledge of premature birth.  Current Allergies, Complete Past Medical History, Past Surgical History, Family History, and Social History were reviewed in [facility] record.  ROS  The following are not active  complaints unless bolded Hoarseness, sore throat, dysphagia, dental problems, itching, sneezing,  nasal congestion or discharge of excess mucus or purulent secretions, ear ache,   fever, chills, sweats, unintended wt loss or wt gain, classically pleuritic or exertional cp,  orthopnea pnd or arm/hand swelling  or leg swelling, presyncope, palpitations, abdominal pain, anorexia, nausea, vomiting, diarrhea  or change in bowel habits or change in bladder habits, change in stools or change in urine, dysuria, hematuria,  rash, arthralgias, visual complaints, headache, numbness, weakness or ataxia or problems with walking or coordination,  change in mood or  memory.        Current Meds  Medication Sig   Ascorbic Acid (VITAMIN C) 100 MG tablet Take 100 mg by mouth daily.   B Complex Vitamins (B COMPLEX 100 PO) Take 1 tablet by mouth daily.   cefdinir (OMNICEF) 300 MG capsule Take 1 capsule (300 mg total) by mouth 2 (two) times daily.   Cholecalciferol (CVS VIT D 5000 HIGH-POTENCY PO) Take 1 tablet by mouth daily.   coenzyme Q10-levOCARNitine (CO Q-10 PLUS) 100-20 MG CAPS Take 200 mg by mouth daily.   famotidine (PEPCID) 20 MG tablet One after supper   losartan (COZAAR) 25 MG tablet Take 1 tablet (25 mg total) by mouth daily.   Magnesium 400 MG CAPS Take by mouth.   metFORMIN (GLUCOPHAGE) 500 MG tablet Take 500 mg by mouth 2 (  two) times daily.   pantoprazole (PROTONIX) 40 MG tablet TAKE 1 TABLET BY MOUTH DAILY 30  TO 60 MINUTES BEFORE FIRST MEAL  OF THE DAY   PARoxetine (PAXIL) 20 MG tablet Take 20 mg by mouth daily.   triamcinolone (NASACORT) 55 MCG/ACT AERO nasal inhaler Place 2 sprays into the nose daily.   VITAMIN A PO Take 1 tablet by mouth daily.   vitamin B-12 (CYANOCOBALAMIN) 500 MCG tablet Take 500 mcg by mouth daily.   vitamin E 180 MG (400 UNITS) capsule Take 400 Units by mouth daily.             Past Medical History:  Diagnosis Date   Asbestos exposure    Diabetes mellitus without  complication (HCC)    Emphysema lung (HCC)      Objective:    Wt  10/28/2021        ***  07/29/2021       163   04/29/2021       178 03/14/2021     189  07/15/2020         193 06/18/2020         191  02/15/20 189 lb (85.7 kg)  11/10/19 185 lb (83.9 kg)  10/05/19 200 lb (90.7 kg)    Vital signs reviewed  10/28/2021  - Note at rest 02 sats  97% on RA   General appearance:    robust amb wm and     HEENT : Oropharynx  clear     Nasal turbinates nl    NECK :  without  apparent JVD/ palpable Nodes/TM    LUNGS: no acc muscle use,  Nl contour chest with a few insp crackles in bases bilaterally without cough on insp or exp maneuvers   CV:  RRR  no s3 or murmur or increase in P2, and no edema   ABD:  soft and nontender with nl inspiratory excursion in the supine position. No bruits or organomegaly appreciated   MS:  Nl gait/ ext warm without deformities Or obvious joint restrictions  calf tenderness, cyanosis -  NO clubbing    SKIN: warm and dry without lesions    NEURO:  alert, approp, nl sensorium with  no motor or cerebellar deficits apparent.          CXR PA and Lateral:   07/29/2021 :    I personally reviewed images and agree with radiology impression as follows:    No active cardiopulmonary disease. Chronic patchy opacities of bilateral mid and lower lung bases are stable. Bilateral pleural thickening are unchanged.   Assessment

## 2021-10-29 ENCOUNTER — Encounter: Payer: Self-pay | Admitting: Internal Medicine

## 2021-10-29 NOTE — Assessment & Plan Note (Addendum)
Long h/o exposure hx including working for [employer] in [place] in the ? 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 No definitive imaging findings to suggest asbestosis at this time. 2. Small pulmonary nodules scattered throughout the lungs bilaterally, stable compared to prior examinations, largest of which has a mean diameter of 8 mm in the left lower lobe. Non-contrast chest CT at 12 months is recommended> placed in reminder file for 02/04/21 CT 02/17/21  1. Asbestos related pleural disease. Thin rind of loculated pleural fluid bilaterally, similar on the right and new on the left. Associated basilar predominant parenchymal banding and volume loss, progressive from 04/27/2019. Findings may be due to asbestosis. If further evaluation is desired, high-resolution chest CT without contrast is recommended in future evaluation. 2. Pulmonary nodules are unchanged from 04/27/2019 and considered benign. 3. Cirrhosis. - 07/29/2021   Walked on RA  x  3  lap(s) =  approx 450  ft  @ fast pace, stopped due to end of study s sob  with lowest 02 sats 91%  - 10/28/2021   Walked on RA  x  3  lap(s) =  approx 450  ft  @ moderate pace, stopped due to end of study  with lowest 02 sats 92% and no sob    Advised on sub max walking with goal to maintain 02 sats > 90% or consider amb 02 and he strongly prefers the latter.  F/u q 6 m call sooner prn   Each maintenance medication was reviewed in detail including emphasizing most importantly the difference between maintenance and prns and under what circumstances the prns are to be triggered using an action plan format where appropriate.  Total time for H and P, chart review, counseling,  directly observing portions of ambulatory 02 saturation study/ and generating customized AVS unique to this office visit / same day charting = 25 mn

## 2022-01-12 ENCOUNTER — Other Ambulatory Visit (HOSPITAL_COMMUNITY): Payer: Self-pay | Admitting: Nurse Practitioner

## 2022-01-12 ENCOUNTER — Ambulatory Visit (HOSPITAL_COMMUNITY)
Admission: RE | Admit: 2022-01-12 | Discharge: 2022-01-12 | Disposition: A | Discharge status: Home / Self Care | Payer: Medicare Other | Source: Ambulatory Visit | Attending: Nurse Practitioner | Admitting: Nurse Practitioner

## 2022-01-12 DIAGNOSIS — M549 Dorsalgia, unspecified: Secondary | ICD-10-CM

## 2022-01-21 ENCOUNTER — Ambulatory Visit (INDEPENDENT_AMBULATORY_CARE_PROVIDER_SITE_OTHER): Payer: Medicare Other

## 2022-01-21 ENCOUNTER — Encounter: Payer: Self-pay | Admitting: Orthopaedic Surgery

## 2022-01-21 ENCOUNTER — Ambulatory Visit (INDEPENDENT_AMBULATORY_CARE_PROVIDER_SITE_OTHER): Payer: Medicare Other | Admitting: Orthopaedic Surgery

## 2022-01-21 VITALS — BP 127/71 | HR 76 | Ht 71.0 in | Wt 156.0 lb

## 2022-01-21 DIAGNOSIS — M545 Low back pain, unspecified: Secondary | ICD-10-CM

## 2022-01-21 DIAGNOSIS — G8929 Other chronic pain: Secondary | ICD-10-CM

## 2022-01-21 MED ORDER — CYCLOBENZAPRINE HCL 10 MG PO TABS: 10.0000 mg | ORAL_TABLET | Freq: Every day | ORAL | 0 refills | Status: DC

## 2022-01-21 NOTE — Progress Notes (Signed)
Subjective:    Patient ID: Dominic [name], male    DOB: 01-Apr-1940, 80 y.o.   MRN: [id number]  HPI He fell and hurt his left side around 01-05-22.  He was seen at [facility] on 01-12-22 for this. X-rays were done of the thoracic spine.  I have reviewed the notes and the X-rays.  I have independently reviewed and interpreted x-rays of this patient done at another site by another physician or qualified health professional.  He has pain of the upper left lower back that is getting better.  He has no numbness, no redness or swelling.  He has more pain bending to the right.  He has pain at night sometimes.    Review of Systems  Constitutional:  Positive for activity change.  Musculoskeletal:  Positive for arthralgias and back pain.  All other systems reviewed and are negative. For Review of Systems, all other systems reviewed and are negative.  The following is a summary of the past history medically, past history surgically, known current medicines, social history and family history.  This information is gathered electronically by the computer from prior information and documentation.  I review this each visit and have found including this information at this point in the chart is beneficial and informative.   Past Medical History:  Diagnosis Date   Asbestos exposure    Diabetes mellitus without complication (HCC)    Emphysema lung (HCC)     Past Surgical History:  Procedure Laterality Date   APPENDECTOMY      Current Outpatient Medications on File Prior to Visit  Medication Sig Dispense Refill   Ascorbic Acid (VITAMIN C) 100 MG tablet Take 100 mg by mouth daily.     B Complex Vitamins (B COMPLEX 100 PO) Take 1 tablet by mouth daily.     cefdinir (OMNICEF) 300 MG capsule Take 1 capsule (300 mg total) by mouth 2 (two) times daily. 20 capsule 0   Cholecalciferol (CVS VIT D 5000 HIGH-POTENCY PO) Take 1 tablet by mouth daily.     coenzyme Q10-levOCARNitine (CO Q-10 PLUS)  100-20 MG CAPS Take 200 mg by mouth daily.     famotidine (PEPCID) 20 MG tablet One after supper 30 tablet 11   losartan (COZAAR) 25 MG tablet Take 1 tablet (25 mg total) by mouth daily. 30 tablet 11   Magnesium 400 MG CAPS Take by mouth.     metFORMIN (GLUCOPHAGE) 500 MG tablet Take 500 mg by mouth 2 (two) times daily.     pantoprazole (PROTONIX) 40 MG tablet TAKE 1 TABLET BY MOUTH DAILY 30  TO 60 MINUTES BEFORE FIRST MEAL  OF THE DAY 90 tablet 3   PARoxetine (PAXIL) 20 MG tablet Take 20 mg by mouth daily.     triamcinolone (NASACORT) 55 MCG/ACT AERO nasal inhaler Place 2 sprays into the nose daily. 1 each 12   VITAMIN A PO Take 1 tablet by mouth daily.     vitamin B-12 (CYANOCOBALAMIN) 500 MCG tablet Take 500 mcg by mouth daily.     vitamin E 180 MG (400 UNITS) capsule Take 400 Units by mouth daily.     No current facility-administered medications on file prior to visit.    Social History   Socioeconomic History   Marital status: Married    Spouse name: Not on file   Number of children: Not on file   Years of education: Not on file   Highest education level: Not on file  Occupational History  Not on file  Tobacco Use   Smoking status: Former    Packs/day: 1.50    Types: Cigarettes    Quit date: 1980    Years since quitting: 43.8   Smokeless tobacco: Never  Vaping Use   Vaping Use: Never used  Substance and Sexual Activity   Alcohol use: Never   Drug use: Never   Sexual activity: Not on file  Other Topics Concern   Not on file  Social History Narrative   Not on file   Social Determinants of Health   Financial Resource Strain: Not on file  Food Insecurity: Not on file  Transportation Needs: Not on file  Physical Activity: Not on file  Stress: Not on file  Social Connections: Not on file  Intimate Partner Violence: Not on file    Family History  Problem Relation Age of Onset   Lung cancer Mother    Lung cancer Father    Kidney failure Brother     BP 127/71    Pulse 76   Ht 5\' 11"  (1.803 m)   Wt 156 lb (70.8 kg)   BMI 21.76 kg/m   Body mass index is 21.76 kg/m.      Objective:   Physical Exam Vitals and nursing note reviewed. Exam conducted with a chaperone present.  Constitutional:      Appearance: He is well-developed.  HENT:     Head: Normocephalic and atraumatic.  Eyes:     Conjunctiva/sclera: Conjunctivae normal.     Pupils: Pupils are equal, round, and reactive to light.  Cardiovascular:     Rate and Rhythm: Normal rate and regular rhythm.  Pulmonary:     Effort: Pulmonary effort is normal.  Abdominal:     Palpations: Abdomen is soft.  Musculoskeletal:       Arms:     Cervical back: Normal range of motion and neck supple.  Skin:    General: Skin is warm and dry.  Neurological:     Mental Status: He is alert and oriented to person, place, and time.     Cranial Nerves: No cranial nerve deficit.     Motor: No abnormal muscle tone.     Coordination: Coordination normal.     Deep Tendon Reflexes: Reflexes are normal and symmetric. Reflexes normal.  Psychiatric:        Behavior: Behavior normal.        Thought Content: Thought content normal.        Judgment: Judgment normal.   X-rays were done of the lumbar spine, reported separately.        Assessment & Plan:   Encounter Diagnosis  Name Primary?   Chronic bilateral low back pain without sciatica Yes   I have given Rx for Flexeril at night.  I have offered PT but he declines.  Return in three weeks.  Call if any problem.  Precautions discussed.  Electronically Signed , MD 11/8/202311:26 AM

## 2022-01-21 NOTE — Patient Instructions (Addendum)
As the weather changes and gets cooler, you may notice you are affected more. You may have more pain in your joints. This is normal. Dress warmly and make sure that area is covered well.   Dr.Keeling is here all day on Tuesdays, Wednesday mornings, and Thursday mornings. If you need anything such as a medication refill, please either call BEFORE the end of the day on [facility] or send a message through [place]. Your pharmacy can send a refill request for you. Calling by the end of the day on [facility] allows [place] time to send Dr.Keeling the request and for him to respond before he leaves on Thursdays.  If Dr. Hilda Lias is out of the office, we may send it to one of the other providers and they may not refill it for the same amount that your original prescription is for.   LET SOMEONE IN YOUR HOUSE KNOW THAT YOU ARE STARTING A MUSCLE RELAXER IN CASE YOU ARE HARD TO AROUSE THE NEXT MORNING. THIS MUSCLE RELAXER CAN MAKE YOU SLEEPY AND HARDER TO AROUSE IN THE MORNING.   MY NAME IS ABBY AND I AM DR.KEELING'S ASSISTANT. IF YOU NEED ANYTHING BEFORE YOUR NEXT APPOINTMENT PLEASE CALL OUR OFFICE [phone]

## 2022-02-11 ENCOUNTER — Ambulatory Visit (INDEPENDENT_AMBULATORY_CARE_PROVIDER_SITE_OTHER): Payer: Medicare Other | Admitting: Orthopaedic Surgery

## 2022-02-11 ENCOUNTER — Encounter: Payer: Self-pay | Admitting: Orthopaedic Surgery

## 2022-02-11 DIAGNOSIS — M545 Low back pain, unspecified: Secondary | ICD-10-CM | POA: Diagnosis not present

## 2022-02-11 DIAGNOSIS — G8929 Other chronic pain: Secondary | ICD-10-CM | POA: Diagnosis not present

## 2022-02-11 MED ORDER — CYCLOBENZAPRINE HCL 10 MG PO TABS: 10.0000 mg | ORAL_TABLET | Freq: Every day | ORAL | 0 refills | Status: DC

## 2022-02-11 NOTE — Patient Instructions (Signed)
Take your medicine at night as you need it.   As the weather changes and gets cooler, you may notice you are affected more. You may have more pain in your joints. This is normal. Dress warmly and make sure that area is covered well.   Dr.Keeling is here all day on Tuesdays, Wednesday mornings, and Thursday mornings. If you need anything such as a medication refill, please either call BEFORE the end of the day on [facility] or send a message through [place]. Your pharmacy can send a refill request for you. Calling by the end of the day on [facility] allows [place] time to send Dr.Keeling the request and for him to respond before he leaves on Thursdays.  If Dr. Hilda Lias is out of the office, we may send it to one of the other providers and they may not refill it for the same amount that your original prescription is for.

## 2022-02-11 NOTE — Progress Notes (Signed)
I am better.  He has less pain of the back and thoracic area.  He has no weakness, no spasm.  Spine/Pelvis examination:  Inspection:  Overall, sacoiliac joint benign and hips nontender; without crepitus or defects.   Thoracic spine inspection: Alignment normal without kyphosis present   Lumbar spine inspection:  Alignment  with normal lumbar lordosis, without scoliosis apparent.   Thoracic spine palpation:  without tenderness of spinal processes   Lumbar spine palpation: without tenderness of lumbar area; without tightness of lumbar muscles    Range of Motion:   Lumbar flexion, forward flexion is normal without pain or tenderness    Lumbar extension is full without pain or tenderness   Left lateral bend is normal without pain or tenderness   Right lateral bend is normal without pain or tenderness   Straight leg raising is normal  Strength & tone: normal   Stability overall normal stability  Encounter Diagnosis  Name Primary?   Chronic bilateral low back pain without sciatica Yes   I will refill his Flexeril.  I will see prn.  Call if any problem.  Precautions discussed.  Electronically Signed Darreld Mclean, MD 11/29/20238:14 AM

## 2022-02-13 IMAGING — CT CT CHEST W/O CM
2 of 4 series · 15 of 36 positions shown, 18 images · non-contrast
Comparison: CT 04/27/2019

CLINICAL DATA: Follow-up pulmonary nodule. History of asbestos
related lung disease.

EXAM:
CT CHEST WITHOUT CONTRAST
TECHNIQUE: Multidetector CT imaging of the chest was performed following the
standard protocol without IV contrast.

[Series 2: routine chest without · axial · non-contrast · 0.74mm/px · z∈[-222,+54]mm · 12 of 162 slices shown, 15 images]
[im 12/162  mediastinal]
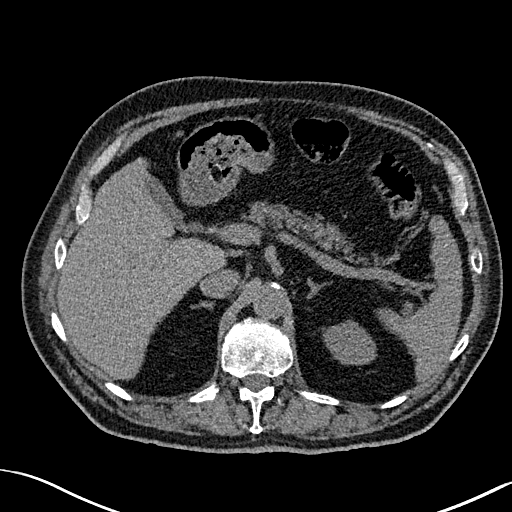
[im 12/162  lung]
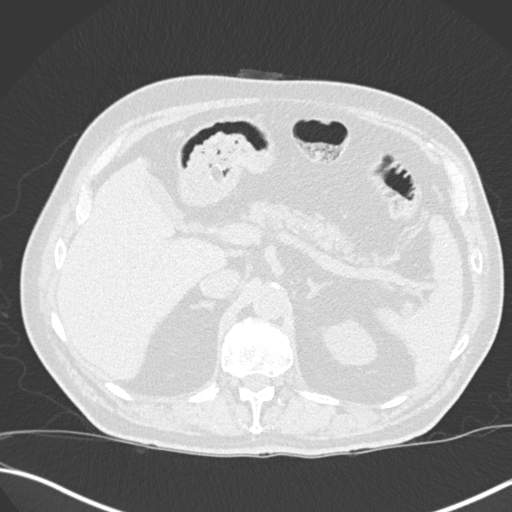
[im 24/162  lung]
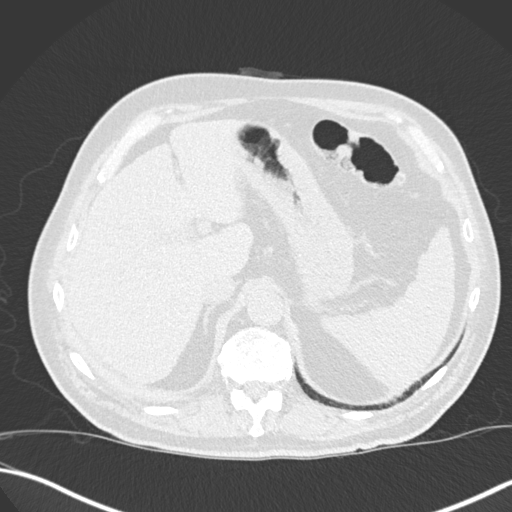
[im 35/162  lung]
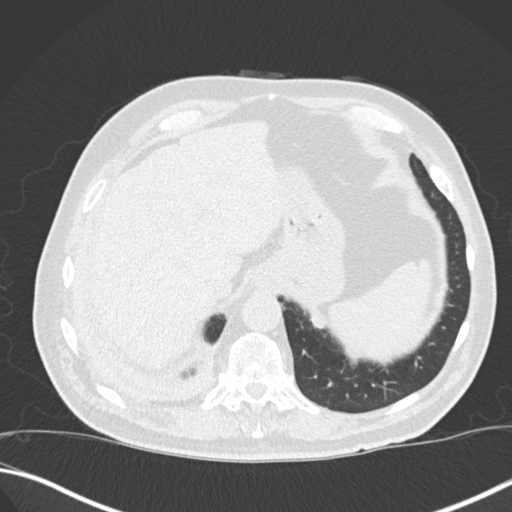
[im 47/162  lung]
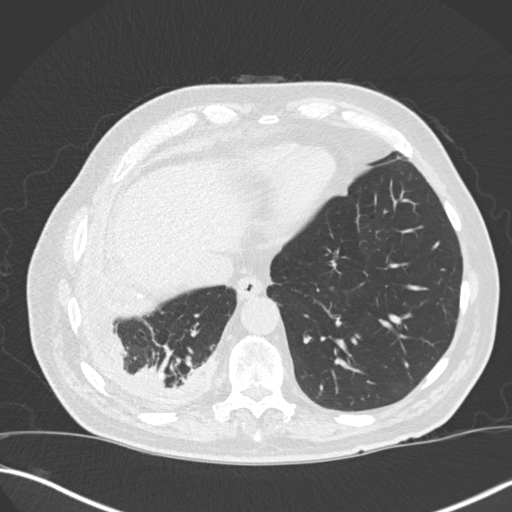
[im 58/162  mediastinal]
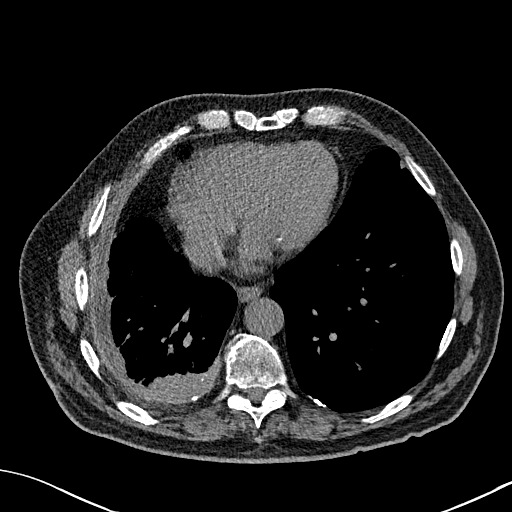
[im 58/162  lung]
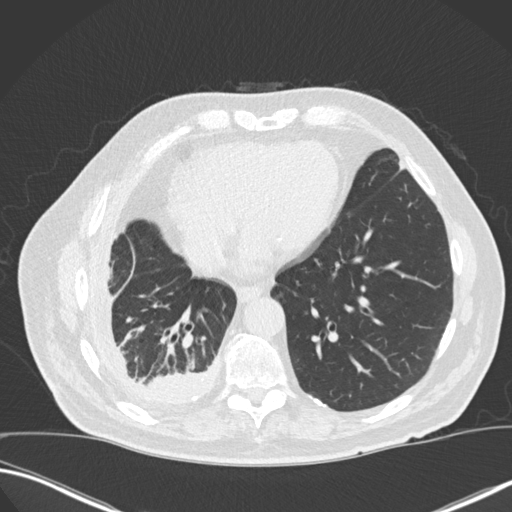
[im 70/162  lung]
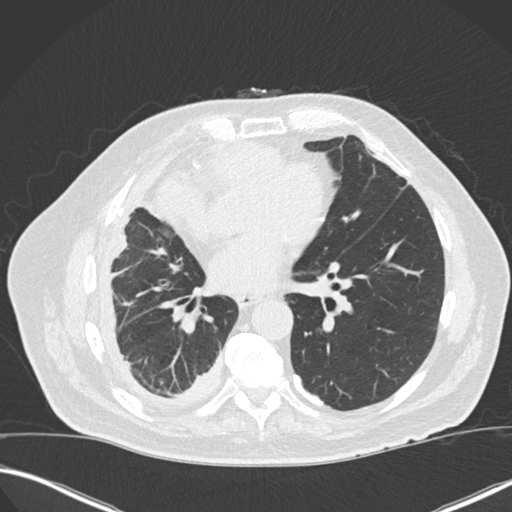
[im 93/162  lung]
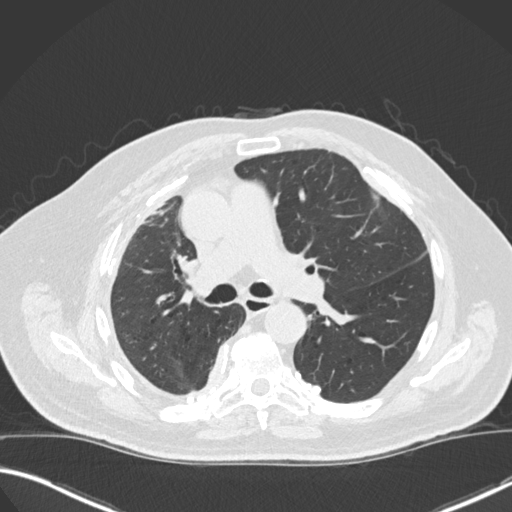
[im 104/162  lung]
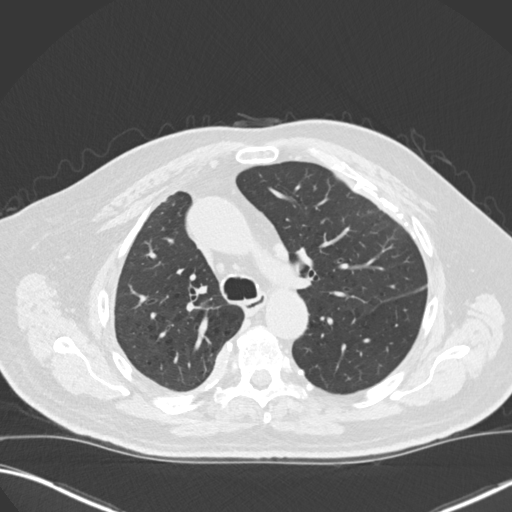
[im 116/162  mediastinal]
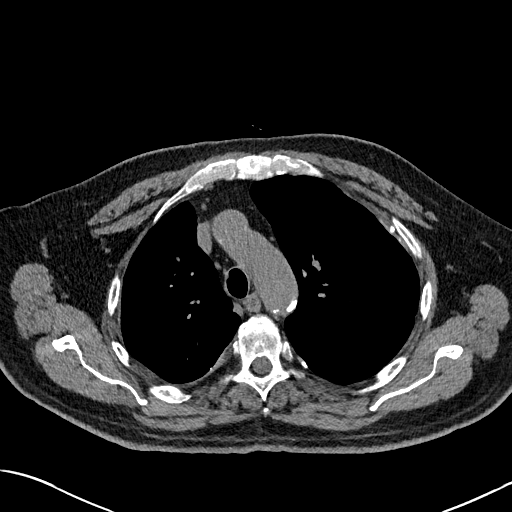
[im 116/162  lung]
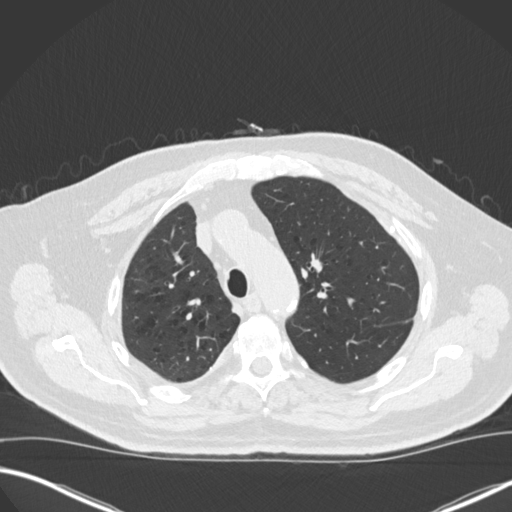
[im 127/162  lung]
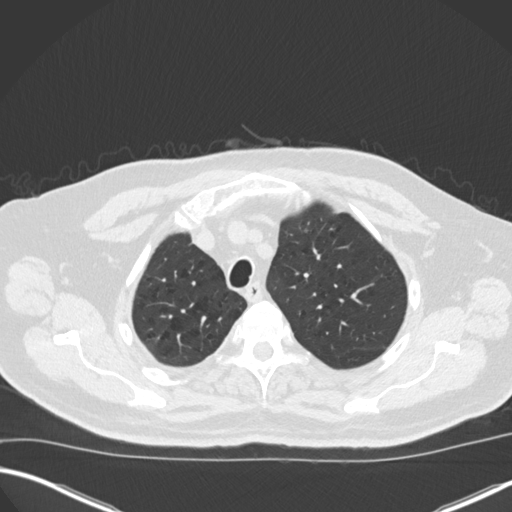
[im 139/162  lung]
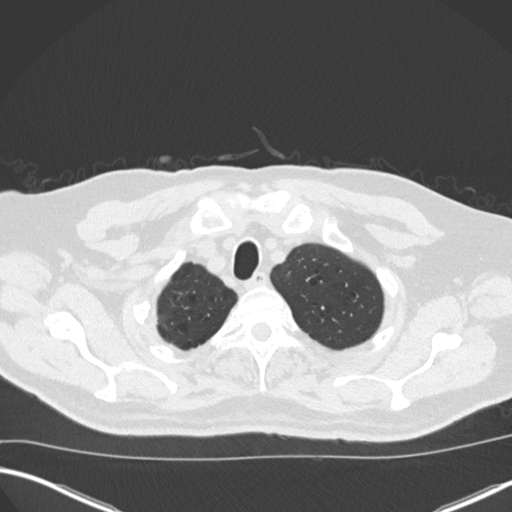
[im 150/162  lung]
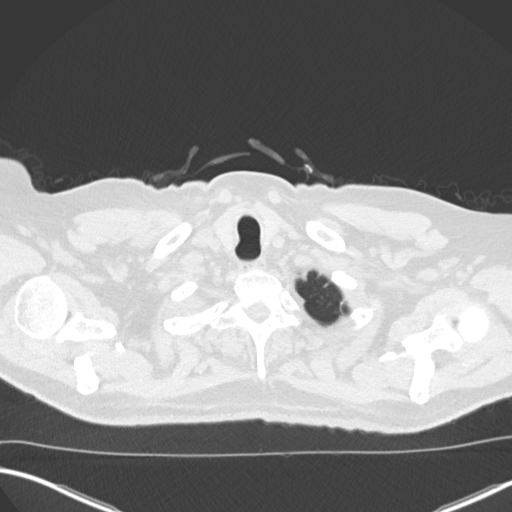

[Series 5: coronal · coronal · 0.64mm/px · 3 of 134 slices shown]
[im 27/134  lung]
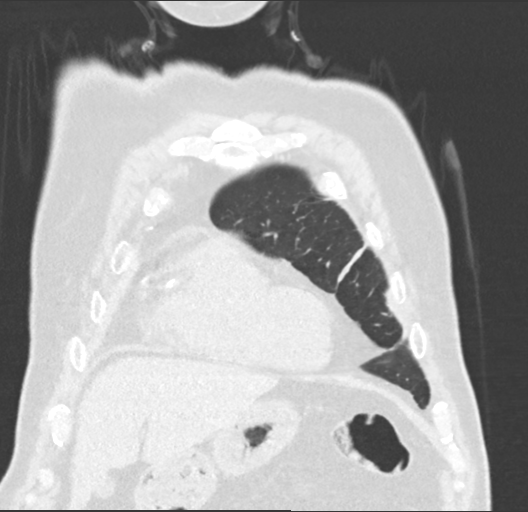
[im 54/134  lung]
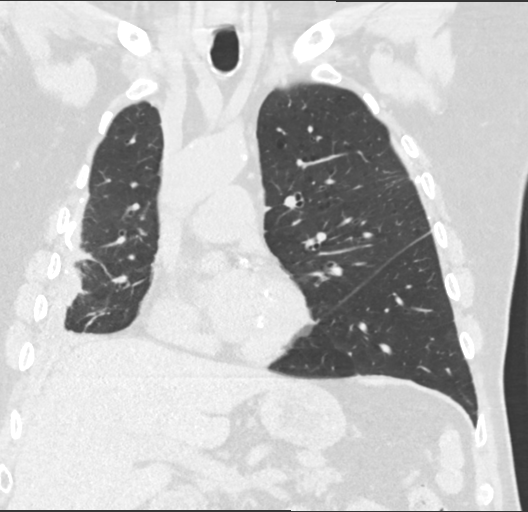
[im 80/134  lung]
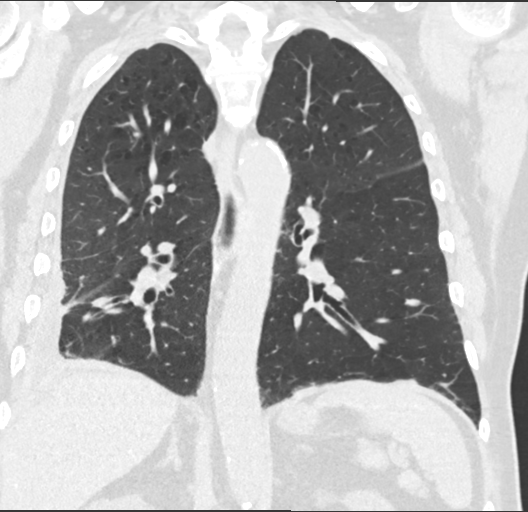

[15 of 36 positions shown; findings below may reference images not displayed]

FINDINGS: Cardiovascular: Normal heart size. No pericardial effusion.
Three-vessel coronary artery atherosclerotic calcification. Thoracic
aorta is normal in course and caliber with scattered atherosclerotic
calcification. Main pulmonary trunk is within normal limits in
diameter.

Mediastinum/Nodes: No axillary or mediastinal lymphadenopathy.
Evaluation for hilar lymphadenopathy is limited in the absence of
intravenous contrast, no definite hilar adenopathy. Unremarkable
thyroid, esophagus, and trachea.

Lungs/Pleura: Extensive calcified pleural plaques bilaterally. Right
pleural thickening with trace loculated right basilar pleural
effusion. Volume loss within the right hemithorax. Redemonstration
of subpleural consolidations abutting areas of pleural thickening
within the right upper lobe, anterior right middle lobe, and
posterior right lower lobe with associated volume loss and
parenchymal distortion. Findings are stable in appearance compared
to the previous CT 04/27/2019.

Stable appearance of scattered bilateral noncalcified pulmonary
nodules, largest within the left lower lobe measuring up to 9 mm
(series 4, image 97). Largest on the right measuring 5 mm (series 4,
image 91). No new pulmonary nodule. Background of mild centrilobular
emphysema. No left-sided pleural effusion. No pneumothorax.

Upper Abdomen: No acute abnormality.

Musculoskeletal: No chest wall mass or suspicious bone lesions
identified.
IMPRESSION: 1. Stable appearance of scattered bilateral noncalcified pulmonary
nodules, largest within the left lower lobe measuring up to 9 mm. No
new or enlarging pulmonary nodule. Per previously listed
recommendations, next follow-up noncontrast chest CT is recommended
in 14-20 months from today's scan for high risk patients.
2. Bilateral calcified pleural plaques with trace loculated right
pleural effusion and extensive right-sided pleuroparenchymal
scarring compatible with asbestos-related pleural disease,
unchanged.
3. Aortic Atherosclerosis (2P0RR-YRW.W) and Emphysema (2P0RR-P3C.K).

## 2022-04-28 ENCOUNTER — Ambulatory Visit: Payer: Medicare Other | Admitting: Internal Medicine

## 2022-04-28 ENCOUNTER — Encounter: Payer: Self-pay | Admitting: Internal Medicine

## 2022-04-28 ENCOUNTER — Ambulatory Visit (HOSPITAL_COMMUNITY)
Admission: RE | Admit: 2022-04-28 | Discharge: 2022-04-28 | Disposition: A | Discharge status: Home / Self Care | Payer: Medicare Other | Source: Ambulatory Visit | Attending: Internal Medicine | Admitting: Internal Medicine

## 2022-04-28 VITALS — BP 138/88 | HR 82 | Ht 71.0 in | Wt 161.2 lb

## 2022-04-28 DIAGNOSIS — J61 Pneumoconiosis due to asbestos and other mineral fibers: Secondary | ICD-10-CM | POA: Insufficient documentation

## 2022-04-28 MED ORDER — PANTOPRAZOLE SODIUM 40 MG PO TBEC: DELAYED_RELEASE_TABLET | ORAL | 3 refills | Status: DC

## 2022-04-28 NOTE — Progress Notes (Unsigned)
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 Fine when resting. Left side of head stays stopped up and drains and makes him have dry cough. Worked in [employer] since 11th grade and was around [employer].  Dyspnea:  Can't do a whole [employer] / one flight of steps is difficult / mb is 200 ft flat s stopping with sats never lower than 93%  Cough: hoarse, sensation of throat tickles x one year  On acei no mucus prod Sleep: nose stopped up / on back bed is flat/ one pillow  SABA use:  Albuterol tid not prn - not sure it helps rec Only use your albuterol as a rescue medication  Also ok  Try albuterol 15 min before an activity  Make sure you check your oxygen saturations at highest level of activity to be sure it stays over 90% and keep track of it at least once a week, more often if breathing getting worse, and let me know if losing ground.  Stop lisinopril and start losartan 25 mg one daily instead     02/15/2020  f/u ov/Dominic [name]/Dominic [name] re: asbestos pleural dz/  Asbestosis  COPD GOLD 0  Essential hypertension . 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 He is using his albuterol inhaler 10 x daily on average.   Dyspnea: much worse x one  week Cough: assoc with brown mucus   no assoc fever,  SABA use: as above / last used it 30 min prior x 2 puffs, had not been using at all prior to a week prior to OV   02: none / sats  Covid status: vax x 3 rec Plan A = Automatic = Always=    Breztri Take 2 puffs first thing in am and then another 2 puffs about 12 hours later.  Work on inhaler technique:    depomedrol 120 mg IM  Plan B = Backup (to supplement plan A, not to replace it) Only use your albuterol inhaler as a rescue medication  zpak should turn mucus  clear For cough / congestion >  mucinex dm 1200 mg every 12 hours as needed         07/15/2020  f/u ov/[place] [name]/Dominic [name] re: GOLD 0 copd/ asbestos pleural plaques/ rhinitis  Chief Complaint  Patient presents with   Follow-up    Breathing has improved since the last visit and no more coughing or wheezing. He is using his albuterol inhaler 3 x per wk on average.   Dyspnea:  Walks a quarter mile with dog occ sob/ mostly just has doe when "toting wood"  Cough: none  Sleeping: fine  SABA use: really only once or twice a week 02: none Covid status: vax x 3 Lung cancer screening: n/a Rec Ok to Try albuterol 15 min before an activity that you know would make you short of breath and see if it makes any difference  Try to wean the Breztri a week a time 1) first try one twice daily  X one week 2) One each am x 1 week  Then stop  Make sure you check  your oxygen saturation at your highest level of activity          03/14/2021  f/u ov/[place] [name]/Dominic [name] re: GOLD 0 COPD/ asbestos pleural dz  maint on no rx  x one month no change on vs off breztri  Chief Complaint  Patient presents with   Follow-up    Feels cough breathing and wheezing have worsened since last ov.    Dyspnea:  50 ft and sats drop to 86% x sev months  Cough: 2-3 x per day/ slt brownish x sev months  Sleeping: worse orthopnea x sev months  SABA use: not using doesn't help  02: none  Covid status: vax x 4  no infection  Overt hb  Rec Omnicef 300 mg twice daily x 10 days  Goal is to keep your 0xygen saturation above 90%  Pantoprazole (protonix) 40 mg   Take  30-60 min before first meal of the day and Pepcid (famotidine)  20 mg after supper until return to [name] GERD diet reviewed, bed blocks rec  Please see your ENT doctor about your nasal symptoms and hoarseness after about 2 weeks (to see what good the antibiotics do)  Please schedule a follow up [name] visit in 6 weeks, call sooner if needed with chest x ray on return      Ent 03/26/21 > rec bud/ atrovent  and continue reflux  10/28/2021  f/u ov/[place] [name]/Dominic [name] re: asbestosis  maint on no rx   Chief Complaint  Patient presents with   Follow-up    Breathing is about the same    Dyspnea:  power walking at mall x 30 min longer distance than baseline and now sats down in 80s toward end but minimal sob  Cough min dry not noct with with ex  Sleeping: bed blocks sleeps fine  SABA use: none  02: none  Covid status: vax x 4  Rec Pace yourself to where your 02 saturations are staying 90% or higher  Please schedule a follow up visit in 6 months but call sooner if needed     04/28/2022  f/u ov/[place] [name]/Dominic [name] re: asbestosis  maint on no rx   Chief Complaint  Patient presents with   Follow-up    Breathing worse since last ov   Dyspnea:  walking up to 40 min at least 3 x weekly /not pacing per  sats as rec  Cough: none  Sleeping: ok  SABA  use: bed blocks no resp cc  02: none      No obvious day to day or daytime variability or assoc excess/ purulent sputum or mucus plugs or hemoptysis or cp or chest tightness, subjective wheeze or overt sinus or hb symptoms.   Sleeping  without nocturnal  or early am exacerbation  of respiratory  c/o's or need for noct saba. Also denies any obvious fluctuation of symptoms with weather or environmental changes or other aggravating or alleviating factors except as outlined above   No unusual exposure hx or h/o childhood pna/ asthma or knowledge of premature birth.  Current Allergies, Complete Past Medical History, Past Surgical History, Family History, and Social History were reviewed in [facility] record.  ROS  The following are not active complaints unless bolded Hoarseness, sore throat, dysphagia, dental problems, itching, sneezing,  nasal congestion or discharge of excess mucus or purulent secretions, ear ache,   fever, chills, sweats, unintended wt loss or wt gain, classically pleuritic or exertional cp,  orthopnea pnd or arm/hand swelling  or leg swelling, presyncope, palpitations, abdominal pain, anorexia, nausea, vomiting, diarrhea  or change in bowel habits or change in bladder habits, change in stools or change in urine, dysuria, hematuria,  rash, arthralgias, visual complaints, headache, numbness, weakness or ataxia or problems with walking or coordination,  change in mood or  memory.        Current Meds  Medication Sig   Ascorbic Acid (VITAMIN C) 100 MG tablet Take 100 mg by mouth daily.   atorvastatin (LIPITOR) 20 MG tablet Take 20 mg by mouth daily.   B Complex Vitamins (B COMPLEX 100 PO) Take 1 tablet by mouth daily.   Cholecalciferol ([employer] VIT D 5000 HIGH-POTENCY PO) Take 1 tablet by mouth daily.   coenzyme Q10-levOCARNitine (CO Q-10 PLUS) 100-20 MG CAPS Take 200 mg by mouth daily.   famotidine (PEPCID) 20 MG  tablet One after supper   losartan (COZAAR) 25 MG tablet Take 1 tablet (25 mg total) by mouth daily.   Magnesium 400 MG CAPS Take by mouth.   PARoxetine (PAXIL) 20 MG tablet Take 20 mg by mouth daily.   triamcinolone (NASACORT) 55 MCG/ACT AERO nasal inhaler Place 2 sprays into the nose daily.   VITAMIN A PO Take 1 tablet by mouth daily.   vitamin B-12 (CYANOCOBALAMIN) 500 MCG tablet Take 500 mcg by mouth daily.   vitamin E 180 MG (400 UNITS) capsule Take 400 Units by mouth daily.               Past Medical History:  Diagnosis Date   Asbestos exposure    Diabetes mellitus without complication ([place])    Emphysema lung ([facility])      Objective:    Wt  04/28/2022       161 10/28/2021       165  07/29/2021       163   04/29/2021       178 03/14/2021     189  07/15/2020         193 06/18/2020         191  02/15/20 189 lb (85.7 kg)  11/10/19 185 lb (83.9 kg)  10/05/19 200 lb (90.7 kg)    Vital signs reviewed  04/28/2022  - Note at rest 02 sats  94% on RA   General appearance:    robust elderly wm nad    HEENT : Oropharynx  clear  NECK :  without  apparent JVD/ palpable Nodes/TM    LUNGS: no acc muscle use,  Nl contour chest with minimal insp crackles both bases   CV:  RRR  no s3 or murmur or increase in P2, and no edema   ABD:  soft and nontender    MS:  Nl gait/ ext warm without deformities Or obvious joint restrictions  calf tenderness, cyanosis or clubbing    SKIN: warm and dry without lesions    NEURO:  alert, approp, nl sensorium with  no motor or cerebellar deficits apparent.      CXR PA and Lateral:   04/28/2022 :    I personally reviewed images and impression is as follows:     No change extensive pleuroparenchymal scarring bilaterally       Assessment

## 2022-04-28 NOTE — Patient Instructions (Addendum)
Add back Pantoprazole (protonix) 40 mg   Take  30-60 min before first meal of the day and continue Pepcid (famotidine)  20 mg after supper until return to office - this is the best way to tell whether stomach acid is contributing to your problem.    Pace yourself to keep your saturations at or above 88% or accept portable oxygen  (Inogen)   Please remember to go to the  x-ray department  @  [facility] for your tests - we will call you with the results when they are available     Please schedule a follow up visit in 3 months but call sooner if needed

## 2022-04-29 NOTE — Assessment & Plan Note (Signed)
Long h/o exposure hx including working for [employer] in [place] in the ? 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 No definitive imaging findings to suggest asbestosis at this time. 2. Small pulmonary nodules scattered throughout the lungs bilaterally, stable compared to prior examinations, largest of which has a mean diameter of 8 mm in the left lower lobe. Non-contrast chest CT at 12 months is recommended> placed in reminder file for 02/04/21 CT 02/17/21  1. Asbestos related pleural disease. Thin rind of loculated pleural fluid bilaterally, similar on the right and new on the left. Associated basilar predominant parenchymal banding and volume loss, progressive from 04/27/2019. Findings may be due to asbestosis. If further evaluation is desired, high-resolution chest CT without contrast is recommended in future evaluation. 2. Pulmonary nodules are unchanged from 04/27/2019 and considered benign. 3. Cirrhosis. cxr report 07/29/21 R loculater effusion with atx RLL  - 07/29/2021   Walked on RA  x  3  lap(s) =  approx 450  ft  @ fast pace, stopped due to end of study s sob  with lowest 02 sats 91%  - 10/28/2021   Walked on RA  x  3  lap(s) =  approx 450  ft  @ moderate pace, stopped due to end of study  with lowest 02 sats 92% and no sob    Minimal clinical progression but now with worse desats with exertion so strongly rec Inogen POC but he declined so advised:  Make sure you check your oxygen saturation at your highest level of activity(NOT after you stop)  to be sure it stays over 90% by pacing yourself at a slower rate  and keep track of it at least once a week, more often if breathing getting worse, and let me know if losing ground. (Collect the dots to connect the dots approach)            Each maintenance medication was reviewed in detail including emphasizing  most importantly the difference between maintenance and prns and under what circumstances the prns are to be triggered using an action plan format where appropriate.  Total time for H and P, chart review, counseling,   and generating customized AVS unique to this office visit / same day charting = 22 min

## 2022-05-12 ENCOUNTER — Ambulatory Visit (INDEPENDENT_AMBULATORY_CARE_PROVIDER_SITE_OTHER): Payer: Medicare Other | Admitting: Internal Medicine

## 2022-05-12 ENCOUNTER — Encounter: Payer: Self-pay | Admitting: Internal Medicine

## 2022-05-12 VITALS — BP 93/60 | HR 96 | Ht 71.0 in | Wt 159.6 lb

## 2022-05-12 DIAGNOSIS — Z0001 Encounter for general adult medical examination with abnormal findings: Secondary | ICD-10-CM | POA: Insufficient documentation

## 2022-05-12 DIAGNOSIS — E119 Type 2 diabetes mellitus without complications: Secondary | ICD-10-CM

## 2022-05-12 DIAGNOSIS — Z8639 Personal history of other endocrine, nutritional and metabolic disease: Secondary | ICD-10-CM | POA: Insufficient documentation

## 2022-05-12 DIAGNOSIS — J449 Chronic obstructive pulmonary disease, unspecified: Secondary | ICD-10-CM

## 2022-05-12 DIAGNOSIS — Z1329 Encounter for screening for other suspected endocrine disorder: Secondary | ICD-10-CM

## 2022-05-12 DIAGNOSIS — Z1321 Encounter for screening for nutritional disorder: Secondary | ICD-10-CM

## 2022-05-12 DIAGNOSIS — F32A Depression, unspecified: Secondary | ICD-10-CM

## 2022-05-12 DIAGNOSIS — E1169 Type 2 diabetes mellitus with other specified complication: Secondary | ICD-10-CM

## 2022-05-12 DIAGNOSIS — J329 Chronic sinusitis, unspecified: Secondary | ICD-10-CM

## 2022-05-12 DIAGNOSIS — I10 Essential (primary) hypertension: Secondary | ICD-10-CM

## 2022-05-12 DIAGNOSIS — K219 Gastro-esophageal reflux disease without esophagitis: Secondary | ICD-10-CM | POA: Insufficient documentation

## 2022-05-12 DIAGNOSIS — E785 Hyperlipidemia, unspecified: Secondary | ICD-10-CM

## 2022-05-12 DIAGNOSIS — E782 Mixed hyperlipidemia: Secondary | ICD-10-CM | POA: Insufficient documentation

## 2022-05-12 DIAGNOSIS — F419 Anxiety disorder, unspecified: Secondary | ICD-10-CM

## 2022-05-12 NOTE — Assessment & Plan Note (Signed)
Presenting today to establish care.  Available records have been reviewed. -Baseline labs ordered today -We will request records from his former PCP office -Tentatively plan for follow-up in 3 months for reassessment

## 2022-05-12 NOTE — Assessment & Plan Note (Signed)
Currently prescribed atorvastatin 20 mg daily.  Repeat lipid panel ordered today.

## 2022-05-12 NOTE — Assessment & Plan Note (Signed)
Mood stable anxiety currently well-controlled with Paxil 20 mg daily.  No medication changes today.

## 2022-05-12 NOTE — Assessment & Plan Note (Signed)
Previously documented history of COPD.  Currently followed by pulmonology (Dr. [name]).  Last seen earlier this month.  He endorses chronic shortness of breath.  Symptoms are unchanged recently.  He is prescribed Nasacort for daily use and has an albuterol inhaler for rescue use. -No medication changes today

## 2022-05-12 NOTE — Patient Instructions (Signed)
It was a pleasure to see you today.  Thank you for giving [place] the opportunity to be involved in your care.  Below is a brief recap of your visit and next steps.  We will plan to see you again in 3 months.  Summary You have established care today We will repeat labs and request records from Dr. Karna Christmas office Follow up in 3 months

## 2022-05-12 NOTE — Assessment & Plan Note (Signed)
Symptoms are well-controlled with Pepcid 20 mg daily and Protonix 40 mg daily.  No medication changes today.

## 2022-05-12 NOTE — Progress Notes (Signed)
New Patient Office Visit  Subjective    Patient ID: Dominic [name], male    DOB: [birth year]  Age: 82 y.o. MRN: [id number]  CC:  Chief Complaint  Patient presents with   [place] presents to establish care.  He is an 82 year old male with a past medical history significant for essential hypertension, anxiety/depression, HLD, GERD, COPD, asbestosis, and diabetes mellitus.  He has most recently received primary care in [place], [place] (Dr. Macarthur Critchley) He is also pfollowed by pulmonology (Dr. [name]).  [name] endorses chronic dyspnea with exertion today.  His symptoms have not significantly worsened recently.  He is otherwise asymptomatic.  His acute concern today is an unintentional weight loss of 70 pounds over the last year.  He states that this was previously worked up by his former PCP, including imaging, with no underlying etiology identified.  He does not have any additional concerns to discuss currently.  Chronic medical conditions and outstanding preventative care items discussed today are individually addressed in A/P below.  Outpatient Encounter Medications as of 05/12/2022  Medication Sig   Ascorbic Acid (VITAMIN C) 100 MG tablet Take 1,000 mg by mouth daily.   atorvastatin (LIPITOR) 20 MG tablet Take 20 mg by mouth daily.   B Complex Vitamins (B COMPLEX 100 PO) Take 1 tablet by mouth daily.   Cholecalciferol ([employer] VIT D 5000 HIGH-POTENCY PO) Take 1 tablet by mouth daily.   coenzyme Q10-levOCARNitine (CO Q-10 PLUS) 100-20 MG CAPS Take 200 mg by mouth daily.   famotidine (PEPCID) 20 MG tablet One after supper   ipratropium (ATROVENT) 0.06 % nasal spray Place 2 sprays into both nostrils 4 (four) times daily as needed for rhinitis.   loperamide (IMODIUM A-D) 2 MG tablet Take 2 mg by mouth 4 (four) times daily as needed for diarrhea or loose stools.   losartan (COZAAR) 25 MG tablet Take 1 tablet (25 mg total) by mouth daily.   Magnesium 400 MG CAPS Take by mouth.    pantoprazole (PROTONIX) 40 MG tablet TAKE 1 TABLET BY MOUTH DAILY 30  TO 60 MINUTES BEFORE FIRST MEAL  OF THE DAY   PARoxetine (PAXIL) 20 MG tablet Take 20 mg by mouth daily.   triamcinolone (NASACORT) 55 MCG/ACT AERO nasal inhaler Place 2 sprays into the nose daily.   VITAMIN A PO Take 1 tablet by mouth daily.   vitamin B-12 (CYANOCOBALAMIN) 500 MCG tablet Take 500 mcg by mouth daily.   vitamin E 180 MG (400 UNITS) capsule Take 400 Units by mouth daily.   No facility-administered encounter medications on file as of 05/12/2022.    Past Medical History:  Diagnosis Date   Asbestos exposure    Diabetes mellitus without complication ([place])    Emphysema lung (HCC)     Past Surgical History:  Procedure Laterality Date   APPENDECTOMY      Family History  Problem Relation Age of Onset   Lung cancer Mother    Lung cancer Father    Kidney failure Brother     Social History   Socioeconomic History   Marital status: Married    Spouse name: Not on file   Number of children: Not on file   Years of education: Not on file   Highest education level: Not on file  Occupational History   Not on file  Tobacco Use   Smoking status: Former    Packs/day: 1.50    Types: Cigarettes    Quit date:  1980    Years since quitting: 44.1   Smokeless tobacco: Never  Vaping Use   Vaping Use: Never used  Substance and Sexual Activity   Alcohol use: Never   Drug use: Never   Sexual activity: Not on file  Other Topics Concern   Not on file  Social History Narrative   Not on file   Social Determinants of Health   Financial Resource Strain: Not on file  Food Insecurity: Not on file  Transportation Needs: Not on file  Physical Activity: Not on file  Stress: Not on file  Social Connections: Not on file  Intimate Partner Violence: Not on file    Review of Systems  Constitutional:  Positive for weight loss. Negative for chills and fever.  HENT:  Negative for sore throat.   Respiratory:   Positive for shortness of breath (Chronic). Negative for cough.   Cardiovascular:  Negative for chest pain, palpitations and leg swelling.  Gastrointestinal:  Negative for abdominal pain, blood in stool, constipation, diarrhea, nausea and vomiting.  Genitourinary:  Negative for dysuria and hematuria.  Musculoskeletal:  Negative for myalgias.  Skin:  Negative for itching and rash.  Neurological:  Negative for dizziness and headaches.  Psychiatric/Behavioral:  Negative for depression and suicidal ideas.    Objective    BP 93/60   Pulse 96   Ht '5\' 11"'$  (1.803 m)   Wt 159 lb 9.6 oz (72.4 kg)   SpO2 93%   BMI 22.26 kg/m   Physical Exam Vitals reviewed.  Constitutional:      General: He is not in acute distress.    Appearance: Normal appearance. He is not ill-appearing.  HENT:     Head: Normocephalic and atraumatic.     Right Ear: External ear normal.     Left Ear: External ear normal.     Nose: Nose normal. No congestion or rhinorrhea.     Mouth/Throat:     Mouth: Mucous membranes are moist.     Pharynx: Oropharynx is clear.  Eyes:     General: No scleral icterus.    Extraocular Movements: Extraocular movements intact.     Conjunctiva/sclera: Conjunctivae normal.     Pupils: Pupils are equal, round, and reactive to light.  Cardiovascular:     Rate and Rhythm: Normal rate and regular rhythm.     Pulses: Normal pulses.     Heart sounds: Normal heart sounds. No murmur heard. Pulmonary:     Effort: Pulmonary effort is normal.     Breath sounds: Normal breath sounds. No wheezing, rhonchi or rales.  Abdominal:     General: Abdomen is flat. Bowel sounds are normal. There is no distension.     Palpations: Abdomen is soft.     Tenderness: There is no abdominal tenderness.  Musculoskeletal:        General: No swelling or deformity. Normal range of motion.     Cervical back: Normal range of motion.  Skin:    General: Skin is warm and dry.     Capillary Refill: Capillary refill  takes less than 2 seconds.  Neurological:     General: No focal deficit present.     Mental Status: He is alert and oriented to person, place, and time.     Motor: No weakness.  Psychiatric:        Mood and Affect: Mood normal.        Behavior: Behavior normal.        Thought Content: Thought content normal.  Assessment & Plan:   Problem List Items Addressed This Visit       Essential hypertension    He is currently prescribed losartan 25 mg daily for treatment of hypertension.  His blood pressure today is 93/60. -No medication changes for now.  If BP remains borderline low at subsequent appointments, consider trialing off antihypertensive therapy.      COPD GOLD 0     Previously documented history of COPD.  Currently followed by pulmonology (Dr. [name]).  Last seen earlier this month.  He endorses chronic shortness of breath.  Symptoms are unchanged recently.  He is prescribed Nasacort for daily use and has an albuterol inhaler for rescue use. -No medication changes today      GERD (gastroesophageal reflux disease)    Symptoms are well-controlled with Pepcid 20 mg daily and Protonix 40 mg daily.  No medication changes today.      Hyperlipidemia associated with type 2 diabetes mellitus ([place])    Currently prescribed atorvastatin 20 mg daily.  Repeat lipid panel ordered today.      Anxiety and depression    Mood stable anxiety currently well-controlled with Paxil 20 mg daily.  No medication changes today.      History of diabetes mellitus    He endorses a past medical history significant for diabetes mellitus.  States that he was previously on metformin, but was taken off as a result of weight loss. -No medication changes today.  Repeat labs, including A1c and urine microalbumin/creatinine ratio, have been ordered.      Encounter for general adult medical examination with abnormal findings - Primary    Presenting today to establish care.  Available records have been  reviewed. -Baseline labs ordered today -We will request records from his former PCP office -Tentatively plan for follow-up in 3 months for reassessment      Return in about 3 months (around 08/10/2022).   Johnette Abraham, MD

## 2022-05-12 NOTE — Assessment & Plan Note (Signed)
He is currently prescribed losartan 25 mg daily for treatment of hypertension.  His blood pressure today is 93/60. -No medication changes for now.  If BP remains borderline low at subsequent appointments, consider trialing off antihypertensive therapy.

## 2022-05-12 NOTE — Assessment & Plan Note (Signed)
He endorses a past medical history significant for diabetes mellitus.  States that he was previously on metformin, but was taken off as a result of weight loss. -No medication changes today.  Repeat labs, including A1c and urine microalbumin/creatinine ratio, have been ordered.

## 2022-05-14 LAB — MICROALBUMIN / CREATININE URINE RATIO
Creatinine, Urine: 93.1 mg/dL
Microalb/Creat Ratio: 23 mg/g creat (ref 0–29)
Microalbumin, Urine: 21 ug/mL

## 2022-05-14 LAB — HEMOGLOBIN A1C
Est. average glucose Bld gHb Est-mCnc: 137 mg/dL
Hgb A1c MFr Bld: 6.4 % — ABNORMAL HIGH (ref 4.8–5.6)

## 2022-06-10 ENCOUNTER — Encounter: Payer: Self-pay | Admitting: Internal Medicine

## 2022-06-10 ENCOUNTER — Ambulatory Visit (INDEPENDENT_AMBULATORY_CARE_PROVIDER_SITE_OTHER): Payer: Medicare Other | Admitting: Internal Medicine

## 2022-06-10 DIAGNOSIS — Z0000 Encounter for general adult medical examination without abnormal findings: Secondary | ICD-10-CM

## 2022-06-10 NOTE — Progress Notes (Signed)
Subjective:  This is a telephone encounter between [facility] and Dominic [name] on 06/10/2022 for AWV. The visit was conducted with the patient located at home and Dominic [name] at [facility]. The patient's identity was confirmed using their DOB and current address. The patient has consented to being evaluated through a telephone encounter and understands the associated risks (an examination cannot be done and the patient may need to come in for an appointment) / benefits (allows the patient to remain at home, decreasing exposure to coronavirus).     Dominic [name] is a 82 y.o. male who presents for Medicare Annual/Subsequent preventive examination.  Review of Systems    Review of Systems  All other systems reviewed and are negative.   Objective:    There were no vitals filed for this visit. There is no height or weight on file to calculate BMI.     06/10/2022    2:02 PM 10/05/2019    4:40 PM 10/05/2019    8:02 AM  Advanced Directives  Does Patient Have a Medical Advance Directive? No Yes Yes  Type of [employer] [place];Living will  Does patient want to make changes to medical advance directive?  No - Patient declined   Copy of [place] in Chart?  No - copy requested   Would patient like information on creating a medical advance directive? Yes (MAU/Ambulatory/Procedural Areas - Information given)      Current Medications (verified) Outpatient Encounter Medications as of 06/10/2022  Medication Sig   Ascorbic Acid (VITAMIN C) 100 MG tablet Take 1,000 mg by mouth daily.   atorvastatin (LIPITOR) 20 MG tablet Take 20 mg by mouth daily.   B Complex Vitamins (B COMPLEX 100 PO) Take 1 tablet by mouth daily.   Cholecalciferol ([employer]S VIT D 5000 HIGH-POTENCY PO) Take 1 tablet by mouth daily.   coenzyme Q10-levOCARNitine (CO Q-10 PLUS) 100-20 MG CAPS Take 200 mg by mouth daily.   famotidine (PEPCID) 20 MG tablet One  after supper   ipratropium (ATROVENT) 0.06 % nasal spray Place 2 sprays into both nostrils 4 (four) times daily as needed for rhinitis.   loperamide (IMODIUM A-D) 2 MG tablet Take 2 mg by mouth 4 (four) times daily as needed for diarrhea or loose stools.   losartan (COZAAR) 25 MG tablet Take 1 tablet (25 mg total) by mouth daily.   Magnesium 400 MG CAPS Take by mouth.   pantoprazole (PROTONIX) 40 MG tablet TAKE 1 TABLET BY MOUTH DAILY 30  TO 60 MINUTES BEFORE FIRST MEAL  OF THE DAY   PARoxetine (PAXIL) 20 MG tablet Take 20 mg by mouth daily.   triamcinolone (NASACORT) 55 MCG/ACT AERO nasal inhaler Place 2 sprays into the nose daily.   VITAMIN A PO Take 1 tablet by mouth daily.   vitamin B-12 (CYANOCOBALAMIN) 500 MCG tablet Take 500 mcg by mouth daily.   vitamin E 180 MG (400 UNITS) capsule Take 400 Units by mouth daily.   No facility-administered encounter medications on file as of 06/10/2022.    Allergies (verified) Patient has no known allergies.   History: Past Medical History:  Diagnosis Date   Asbestos exposure    Diabetes mellitus without complication ([place])    Emphysema lung (HCC)    Past Surgical History:  Procedure Laterality Date   APPENDECTOMY     Family History  Problem Relation Age of Onset   Lung cancer Mother    Lung cancer  Father    Kidney failure Brother    Social History   Socioeconomic History   Marital status: Married    Spouse name: Not on file   Number of children: Not on file   Years of education: Not on file   Highest education level: Not on file  Occupational History   Not on file  Tobacco Use   Smoking status: Former    Packs/day: 1.5    Types: Cigarettes    Quit date: 1980    Years since quitting: 44.2   Smokeless tobacco: Never  Vaping Use   Vaping Use: Never used  Substance and Sexual Activity   Alcohol use: Never   Drug use: Never   Sexual activity: Not on file  Other Topics Concern   Not on file  Social History Narrative   Not  on file   Social Determinants of Health   Financial Resource Strain: Not on file  Food Insecurity: Not on file  Transportation Needs: Not on file  Physical Activity: Not on file  Stress: Not on file  Social Connections: Not on file    Tobacco Counseling Counseling given: Not Answered   Clinical Intake:  Pre-visit preparation completed: Yes  Pain : No/denies pain     Diabetes: Yes (diet controlled) CBG done?: No Did pt. bring in CBG monitor from home?: No  What is the last grade level you completed in school?: 12th grade   Activities of Daily Living    06/10/2022    2:04 PM  In your present state of health, do you have any difficulty performing the following activities:  Hearing? 0  Vision? 0  Difficulty concentrating or making decisions? 0  Walking or climbing stairs? 0  Dressing or bathing? 0  Doing errands, shopping? 0    Patient Care Team: Johnette Abraham, MD as PCP - General (Internal Medicine)  Indicate any recent Medical Services you may have received from other than [facility] providers in the past year (date may be approximate).     Assessment:   This is a routine wellness examination for Dominic [name].  Hearing/Vision screen No results found.  Dietary issues and exercise activities discussed:     Goals Addressed   None    Depression Screen    06/10/2022    2:04 PM 05/12/2022    9:07 AM  PHQ 2/9 Scores  PHQ - 2 Score 0 0  PHQ- 9 Score  9    Fall Risk    06/10/2022    2:04 PM 05/12/2022    9:07 AM  Fall Risk   Falls in the past year? 0 0  Number falls in past yr:  0  Injury with Fall?  0  Risk for fall due to :  No Fall Risks  Follow up  Falls evaluation completed    [place]:  Any stairs in or around the home? No  If so, are there any without handrails? No  Home free of loose throw rugs in walkways, pet beds, electrical cords, etc? Yes  Adequate lighting in your home to reduce risk of falls? Yes    ASSISTIVE DEVICES UTILIZED TO PREVENT FALLS:  Life alert? No  Use of a cane, walker or w/c? No  Grab bars in the bathroom? Yes  Shower chair or bench in shower? Yes  Elevated toilet seat or a handicapped toilet? No    Cognitive Function:        06/10/2022  2:04 PM  6CIT Screen  What Year? 0 points  What month? 0 points  What time? 0 points  Count back from 20 0 points  Months in reverse 0 points  Repeat phrase 0 points  Total Score 0 points    Immunizations Immunization History  Administered Date(s) Administered   Influenza,inj,Quad PF,6+ Mos 12/14/2020   Moderna SARS-COV2 Booster Vaccination 01/18/2020   Moderna Sars-Covid-2 Vaccination 04/19/2019, 05/17/2019   Pfizer Covid-19 Vaccine Bivalent Booster 50yrs & up 01/16/2021    TDAP status: Due, Education has been provided regarding the importance of this vaccine. Advised may receive this vaccine at local pharmacy or Health Dept. Aware to provide a copy of the vaccination record if obtained from local pharmacy or Health Dept. Verbalized acceptance and understanding.  Flu Vaccine status: Up to date  Pneumococcal vaccine status: Due, Education has been provided regarding the importance of this vaccine. Advised may receive this vaccine at local pharmacy or Health Dept. Aware to provide a copy of the vaccination record if obtained from local pharmacy or Health Dept. Verbalized acceptance and understanding.  Covid-19 vaccine status: Information provided on how to obtain vaccines.   Qualifies for Shingles Vaccine? Yes   Zostavax completed No   Shingrix Completed?: No.    Education has been provided regarding the importance of this vaccine. Patient has been advised to call insurance company to determine out of pocket expense if they have not yet received this vaccine. Advised may also receive vaccine at local pharmacy or Health Dept. Verbalized acceptance and understanding.  Screening Tests Health Maintenance  Topic Date  Due   Medicare Annual Wellness (AWV)  Never done   FOOT EXAM  Never done   OPHTHALMOLOGY EXAM  Never done   DTaP/Tdap/Td (1 - Tdap) Never done   Zoster Vaccines- Shingrix (1 of 2) Never done   Diabetic kidney evaluation - eGFR measurement  10/05/2020   COVID-19 Vaccine (5 - 2023-24 season) 11/14/2021   INFLUENZA VACCINE  06/14/2022 (Originally 10/14/2021)   Pneumonia Vaccine 98+ Years old (1 of 2 - PCV) 06/09/2023 (Originally 08/18/1946)   HEMOGLOBIN A1C  11/10/2022   Diabetic kidney evaluation - Urine ACR  05/13/2023   HPV VACCINES  Aged Out    Health Maintenance  Health Maintenance Due  Topic Date Due   Medicare Annual Wellness (AWV)  Never done   FOOT EXAM  Never done   OPHTHALMOLOGY EXAM  Never done   DTaP/Tdap/Td (1 - Tdap) Never done   Zoster Vaccines- Shingrix (1 of 2) Never done   Diabetic kidney evaluation - eGFR measurement  10/05/2020   COVID-19 Vaccine (5 - 2023-24 season) 11/14/2021    Colorectal cancer screening: No longer required.   Lung Cancer Screening: (Low Dose CT Chest recommended if Age 52-80 years, 30 pack-year currently smoking OR have quit w/in 15years.) does not qualify.   Additional Screening:  Hepatitis C Screening: does not qualify  Vision Screening: Recommended annual ophthalmology exams for early detection of glaucoma and other disorders of the eye. Is the patient up to date with their annual eye exam?  No  Who is the provider or what is the name of the office in which the patient attends annual eye exams? He is going to establish with someone.  If pt is not established with a provider, would they like to be referred to a provider to establish care? No .   Dental Screening: Recommended annual dental exams for proper oral hygiene  Community Resource Referral / Chronic Care  Management: CRR required this visit?  No   CCM required this visit?  No      Plan:     I have personally reviewed and noted the following in the patient's chart:    Medical and social history Use of alcohol, tobacco or illicit drugs  Current medications and supplements including opioid prescriptions. Patient is not currently taking opioid prescriptions. Functional ability and status Nutritional status Physical activity Advanced directives List of other physicians Hospitalizations, surgeries, and ER visits in previous 12 months Vitals Screenings to include cognitive, depression, and falls Referrals and appointments  In addition, I have reviewed and discussed with patient certain preventive protocols, quality metrics, and best practice recommendations. A written personalized care plan for preventive services as well as general preventive health recommendations were provided to patient.     Dominic Dy, MD   06/10/2022

## 2022-08-02 NOTE — Progress Notes (Unsigned)
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 Fine when resting. Left side of head stays stopped up and drains and makes him have dry cough. Worked in [employer] since 11th grade and was around [employer].  Dyspnea:  Can't do a whole [employer] / one flight of steps is difficult / mb is 200 ft flat s stopping with sats never lower than 93%  Cough: hoarse, sensation of throat tickles x one year  On acei no mucus prod Sleep: nose stopped up / on back bed is flat/ one pillow  SABA use:  Albuterol tid not prn - not sure it helps rec Only use your albuterol as a rescue medication  Also ok  Try albuterol 15 min before an activity  Make sure you check your oxygen saturations at highest level of activity to be sure it stays over 90% and keep track of it at least once a week, more often if breathing getting worse, and let me know if losing ground.  Stop lisinopril and start losartan 25 mg one daily instead     02/15/2020  f/u ov/[place] [facility]/Tola Meas re: asbestos pleural dz/  Asbestosis  COPD GOLD 0  Essential hypertension . 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 He is using his albuterol inhaler 10 x daily on average.   Dyspnea: much worse x one  week Cough: assoc with brown mucus   no assoc fever,  SABA use: as above / last used it 30 min prior x 2 puffs, had not been using at all prior to a week prior to OV   02: none / sats  Covid status: vax x 3 rec Plan A = Automatic = Always=    Breztri Take 2 puffs first thing in am and then another 2 puffs about 12 hours later.  Work on inhaler technique:    depomedrol 120 mg IM  Plan B = Backup (to supplement plan A, not to replace it) Only use your albuterol inhaler as a rescue medication  zpak should turn mucus clear For cough /  congestion >  mucinex dm 1200 mg every 12 hours as needed        Ent 03/26/21 > rec bud/ atrovent  and continue reflux   04/28/2022  f/u ov/[place] [facility]/Niralya Ohanian re: asbestosis  maint on no rx   Chief Complaint  Patient presents with   Follow-up    Breathing worse since last ov   Dyspnea:  walking up to 40 min at least 3 x weekly /not pacing per sats as rec  Cough: none  Sleeping: ok  SABA use: bed blocks no resp cc  02: none  Rec Add back Pantoprazole (protonix) 40 mg   Take  30-60 min before first meal of the day and continue Pepcid (famotidine)  20 mg after supper until return to office  Pace yourself to keep your saturations at or above 88% or accept portable oxygen  ([employer])    08/03/2022  f/u ov/[place] [facility]/Daysean Tinkham re:  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 He has SOB walking lobby to exam room today.   Dyspnea:  walking at mall x one cirlce x 20 min with lowest sats 85%  Cough: some in am's > brownish yellow x month Sleeping: bed block x 6 in / no resp cc  SABA use: none  02: none    No obvious day to day or daytime variability or assoc excess/ purulent sputum or mucus plugs or hemoptysis or cp or chest tightness, subjective wheeze or overt sinus or hb symptoms.   Sleeping as above  without nocturnal  exacerbation  of respiratory  c/o's or need for noct saba. Also denies any obvious fluctuation of symptoms with weather or environmental changes or other aggravating or alleviating factors except as outlined above   No unusual exposure hx or h/o childhood pna/ asthma or knowledge of premature birth.  Current Allergies, Complete Past Medical History, Past Surgical History, Family History, and Social History were reviewed in [facility] record.  ROS  The following are not active complaints unless bolded Hoarseness, sore throat, dysphagia, dental problems, itching, sneezing,  nasal congestion or discharge of excess mucus or purulent secretions, ear ache,   fever, chills, sweats, unintended wt loss or wt gain, classically pleuritic or exertional cp,  orthopnea pnd or arm/hand swelling  or leg swelling, presyncope, palpitations, abdominal pain, anorexia, nausea, vomiting, diarrhea  or change in bowel habits or change in bladder habits, change in stools or change in urine, dysuria, hematuria,  rash, arthralgias, visual complaints, headache, numbness, weakness or ataxia or problems with walking or coordination,  change in mood or  memory.        Current Meds  Medication Sig   Ascorbic Acid (VITAMIN C) 100 MG tablet Take 1,000 mg by mouth daily.   atorvastatin (LIPITOR) 20 MG tablet Take 20 mg by mouth daily.   B Complex Vitamins (B COMPLEX 100 PO)  Take 1 tablet by mouth daily.   Cholecalciferol ([employer] VIT D 5000 HIGH-POTENCY PO) Take 1 tablet by mouth daily.   coenzyme Q10-levOCARNitine (CO Q-10 PLUS) 100-20 MG CAPS Take 200 mg by mouth daily.   famotidine (PEPCID) 20 MG tablet One after supper   ipratropium (ATROVENT) 0.06 % nasal spray Place 2 sprays into both nostrils 4 (four) times daily as needed for rhinitis.   loperamide (IMODIUM A-D) 2 MG tablet  Take 2 mg by mouth 4 (four) times daily as needed for diarrhea or loose stools.   losartan (COZAAR) 25 MG tablet Take 1 tablet (25 mg total) by mouth daily.   Magnesium 400 MG CAPS Take by mouth.   pantoprazole (PROTONIX) 40 MG tablet TAKE 1 TABLET BY MOUTH DAILY 30  TO 60 MINUTES BEFORE FIRST MEAL  OF THE DAY   PARoxetine (PAXIL) 20 MG tablet Take 20 mg by mouth daily.   triamcinolone (NASACORT) 55 MCG/ACT AERO nasal inhaler Place 2 sprays into the nose daily.   VITAMIN A PO Take 1 tablet by mouth daily.   vitamin B-12 (CYANOCOBALAMIN) 500 MCG tablet Take 500 mcg by mouth daily.   vitamin E 180 MG (400 UNITS) capsule Take 400 Units by mouth daily.                 Past Medical History:  Diagnosis Date   Asbestos exposure    Diabetes mellitus without complication (HCC)    Emphysema lung (HCC)      Objective:    Wt  08/03/2022        ***  04/28/2022       161 10/28/2021       165  07/29/2021       163   04/29/2021       178 03/14/2021     189  07/15/2020         193 06/18/2020         191  02/15/20 189 lb (85.7 kg)  11/10/19 185 lb (83.9 kg)  10/05/19 200 lb (90.7 kg)    Vital signs reviewed  08/03/2022  - Note at rest 02 sats  ***% on ***   General appearance:    ***     minimal insp crackles both bases***         Assessment

## 2022-08-03 ENCOUNTER — Ambulatory Visit: Payer: Medicare Other | Admitting: Internal Medicine

## 2022-08-03 ENCOUNTER — Encounter: Payer: Self-pay | Admitting: Internal Medicine

## 2022-08-03 VITALS — BP 108/64 | HR 80 | Ht 71.0 in | Wt 159.0 lb

## 2022-08-03 DIAGNOSIS — J449 Chronic obstructive pulmonary disease, unspecified: Secondary | ICD-10-CM

## 2022-08-03 DIAGNOSIS — J61 Pneumoconiosis due to asbestos and other mineral fibers: Secondary | ICD-10-CM | POA: Diagnosis not present

## 2022-08-03 DIAGNOSIS — J329 Chronic sinusitis, unspecified: Secondary | ICD-10-CM

## 2022-08-03 MED ORDER — CEFDINIR 300 MG PO CAPS: 300.0000 mg | ORAL_CAPSULE | Freq: Two times a day (BID) | ORAL | 0 refills | Status: DC

## 2022-08-03 NOTE — Patient Instructions (Addendum)
We will walk to qualify you for ambulatory 0xygen > ok to refer for POC eligibility   Make sure you check your oxygen saturation  AT  your highest level of activity (not after you stop)   to be sure it stays over 90% and adjust  02 flow upward to maintain this level if needed but remember to turn it back to previous settings when you stop (to conserve your supply).   Omnicef 300 mg twice daily x 10 days should eliminate the brown mucus   For cough / congestion > mucinex dm up to 1200 mg every 12 hours as needed   Please schedule a follow up visit in 3 months but call sooner if needed

## 2022-08-04 NOTE — Assessment & Plan Note (Signed)
Quit smoking 1980/M phenotype for alpha one  - Emphysema on CT chest 04/27/19 - PFT's  02/06/2020  FEV1 1.65 (55 % ) ratio 0.71  p 4 % improvement from saba p 0 prior to study with DLCO  15.17 (60%) corrects to 3.70 (95%)  for alv volume and FV curve mild concavity  - Labs ordered 02/15/2020  :     alpha one AT phenotype   (since has emphysema on ct)   MM  Level 84 - 06/18/2020    try breztri 2bid x 2 weeks samples  > ? Better 07/15/2020   - 07/15/2020  After extensive coaching inhaler device,  effectiveness =    90% > try to wean breztri (hoarseness and not sure it really helped)  -  07/15/2020   Walked RA  approx   500 ft  @ fast pace  stopped due to  End of study, very min  sob, sats 93%    Mild flare of bronchitis > rx omnicef 300 mg bid / mucinex dm and step up f/u to q 3 m at this point   Each maintenance medication was reviewed in detail including emphasizing most importantly the difference between maintenance and prns and under what circumstances the prns are to be triggered using an action plan format where appropriate.  Total time for H and P, chart review, counseling,  directly observing portions of ambulatory 02 saturation study/ and generating customized AVS unique to this office visit / same day charting = 24 min

## 2022-08-04 NOTE — Assessment & Plan Note (Signed)
Long h/o exposure hx including working for [employer] in [place] in the ? 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 No definitive imaging findings to suggest asbestosis at this time. 2. Small pulmonary nodules scattered throughout the lungs bilaterally, stable compared to prior examinations, largest of which has a mean diameter of 8 mm in the left lower lobe. Non-contrast chest CT at 12 months is recommended> placed in reminder file for 02/04/21 CT 02/17/21  1. Asbestos related pleural disease. Thin rind of loculated pleural fluid bilaterally, similar on the right and new on the left. Associated basilar predominant parenchymal banding and volume loss, progressive from 04/27/2019. Findings may be due to asbestosis. If further evaluation is desired, high-resolution chest CT without contrast is recommended in future evaluation. 2. Pulmonary nodules are unchanged from 04/27/2019 and considered benign. 3. Cirrhosis. cxr report 07/29/21 R loculated effusion with atx RLL  - 07/29/2021   Walked on RA  x  3  lap(s) =  approx 450  ft  @ fast pace, stopped due to end of study s sob  with lowest 02 sats 91%  - 10/28/2021   Walked on RA  x  3  lap(s) =  approx 450  ft  @ moderate pace, stopped due to end of study  with lowest 02 sats 92% and no sob   - 08/03/2022   Walked on RA  x  3  lap(s) =  approx 450  ft  @ mod   pace, stopped due to end of study  with lowest 02 sats 93% and min sob   Does not qualify for amb 02 based on today's eval/ advised to pace himself and continue Make sure you check your oxygen saturation at your highest level of activity(NOT after you stop)  to be sure it stays over 90% and keep track of it at least once a week, more often if breathing getting worse, and let me know if losing ground. (Collect the dots to connect the dots approach)

## 2022-08-13 ENCOUNTER — Encounter: Payer: Self-pay | Admitting: Internal Medicine

## 2022-08-13 ENCOUNTER — Ambulatory Visit (INDEPENDENT_AMBULATORY_CARE_PROVIDER_SITE_OTHER): Payer: Medicare Other | Admitting: Internal Medicine

## 2022-08-13 VITALS — BP 116/71 | HR 72 | Ht 71.0 in | Wt 164.0 lb

## 2022-08-13 DIAGNOSIS — E119 Type 2 diabetes mellitus without complications: Secondary | ICD-10-CM | POA: Diagnosis not present

## 2022-08-13 DIAGNOSIS — E785 Hyperlipidemia, unspecified: Secondary | ICD-10-CM | POA: Diagnosis not present

## 2022-08-13 DIAGNOSIS — I10 Essential (primary) hypertension: Secondary | ICD-10-CM

## 2022-08-13 DIAGNOSIS — E1169 Type 2 diabetes mellitus with other specified complication: Secondary | ICD-10-CM | POA: Diagnosis not present

## 2022-08-13 DIAGNOSIS — L602 Onychogryphosis: Secondary | ICD-10-CM | POA: Diagnosis not present

## 2022-08-13 DIAGNOSIS — K219 Gastro-esophageal reflux disease without esophagitis: Secondary | ICD-10-CM | POA: Diagnosis not present

## 2022-08-13 DIAGNOSIS — Z139 Encounter for screening, unspecified: Secondary | ICD-10-CM | POA: Diagnosis not present

## 2022-08-13 DIAGNOSIS — F419 Anxiety disorder, unspecified: Secondary | ICD-10-CM

## 2022-08-13 DIAGNOSIS — F32A Depression, unspecified: Secondary | ICD-10-CM

## 2022-08-13 NOTE — Assessment & Plan Note (Signed)
Currently prescribed atorvastatin 20 mg daily.  Repeat lipid panel ordered today. 

## 2022-08-13 NOTE — Assessment & Plan Note (Signed)
Symptoms remain well-controlled with Pepcid and Protonix.  No medication changes are indicated today.

## 2022-08-13 NOTE — Progress Notes (Signed)
Established Patient Office Visit  Subjective   Patient ID: Dominic [name], male    DOB: 1940-11-10  Age: 82 y.o. MRN: [id number]  Chief Complaint  Patient presents with   Follow-up    Follow up   Dominic [name] returns to care today for routine follow-up.  He was last evaluated by me on 2/27 as a new patient presenting to establish care.  No medication changes were made at that time and routine lab work was ordered.  In the interim he has been seen by pulmonology (Dr. [name]) for follow-up.  There have otherwise been no acute interval events.  Dominic [name] reports feeling well today.  He is asymptomatic and has no acute concerns to discuss.  Past Medical History:  Diagnosis Date   Asbestos exposure    Diabetes mellitus without complication (HCC)    Emphysema lung (HCC)    Past Surgical History:  Procedure Laterality Date   APPENDECTOMY     Social History   Tobacco Use   Smoking status: Former    Packs/day: 1.5    Types: Cigarettes    Quit date: 1980    Years since quitting: 44.4   Smokeless tobacco: Never  Vaping Use   Vaping Use: Never used  Substance Use Topics   Alcohol use: Never   Drug use: Never   Family History  Problem Relation Age of Onset   Lung cancer Mother    Lung cancer Father    Kidney failure Brother    No Known Allergies  Review of Systems  Constitutional:  Negative for chills and fever.  HENT:  Negative for sore throat.   Respiratory:  Positive for shortness of breath (chronic - at baseline). Negative for cough.   Cardiovascular:  Negative for chest pain, palpitations and leg swelling.  Gastrointestinal:  Negative for abdominal pain, blood in stool, constipation, diarrhea, nausea and vomiting.  Genitourinary:  Negative for dysuria and hematuria.  Musculoskeletal:  Negative for myalgias.  Skin:  Negative for itching and rash.  Neurological:  Negative for dizziness and headaches.  Psychiatric/Behavioral:  Negative for depression and suicidal ideas.       Objective:     BP 116/71 (BP Location: Right Arm, Patient Position: Sitting, Cuff Size: Normal)   Pulse 72   Ht 5\' 11"  (1.803 m)   Wt 164 lb (74.4 kg)   SpO2 92%   BMI 22.87 kg/m  BP Readings from Last 3 Encounters:  08/13/22 116/71  08/03/22 108/64  05/12/22 93/60   Physical Exam Vitals reviewed.  Constitutional:      General: He is not in acute distress.    Appearance: Normal appearance. He is not ill-appearing.  HENT:     Head: Normocephalic and atraumatic.     Right Ear: External ear normal.     Left Ear: External ear normal.     Nose: Nose normal. No congestion or rhinorrhea.     Mouth/Throat:     Mouth: Mucous membranes are moist.     Pharynx: Oropharynx is clear.  Eyes:     General: No scleral icterus.    Extraocular Movements: Extraocular movements intact.     Conjunctiva/sclera: Conjunctivae normal.     Pupils: Pupils are equal, round, and reactive to light.  Cardiovascular:     Rate and Rhythm: Normal rate and regular rhythm.     Pulses: Normal pulses.     Heart sounds: Normal heart sounds. No murmur heard. Pulmonary:     Effort: Pulmonary effort is normal.  Breath sounds: Normal breath sounds. No wheezing, rhonchi or rales.  Abdominal:     General: Abdomen is flat. Bowel sounds are normal. There is no distension.     Palpations: Abdomen is soft.     Tenderness: There is no abdominal tenderness.  Musculoskeletal:        General: No swelling or deformity. Normal range of motion.     Cervical back: Normal range of motion.  Skin:    General: Skin is warm and dry.     Capillary Refill: Capillary refill takes less than 2 seconds.  Neurological:     General: No focal deficit present.     Mental Status: He is alert and oriented to person, place, and time.     Motor: No weakness.  Psychiatric:        Mood and Affect: Mood normal.        Behavior: Behavior normal.        Thought Content: Thought content normal.   Diabetic foot exam was performed.   Visual Findings: thickened toenails Posterior tibialis and dorsalis pulse intact bilaterally.  Intact to touch and monofilament testing bilaterally.    Last CBC Lab Results  Component Value Date   WBC 7.6 03/14/2021   HGB 11.7 (L) 03/14/2021   HCT 36.6 (L) 03/14/2021   MCV 83 03/14/2021   MCH 26.4 (L) 03/14/2021   RDW 14.4 03/14/2021   PLT 384 03/14/2021   Last metabolic panel Lab Results  Component Value Date   GLUCOSE 115 (H) 10/06/2019   NA 137 10/06/2019   K 4.3 10/06/2019   CL 105 10/06/2019   CO2 24 10/06/2019   BUN 22 10/06/2019   CREATININE 0.76 10/06/2019   GFRNONAA >60 10/06/2019   CALCIUM 8.6 (L) 10/06/2019   PROT 7.2 10/05/2019   ALBUMIN 3.1 (L) 10/05/2019   BILITOT 1.6 (H) 10/05/2019   ALKPHOS 91 10/05/2019   AST 30 10/05/2019   ALT 28 10/05/2019   ANIONGAP 8 10/06/2019   Last hemoglobin A1c Lab Results  Component Value Date   HGBA1C 6.4 (H) 05/12/2022   Last thyroid functions Lab Results  Component Value Date   TSH 1.219 02/15/2020     Assessment & Plan:   Problem List Items Addressed This Visit       Essential hypertension    BP remains well-controlled on losartan 25 mg daily.  No medication changes are indicated today.      GERD (gastroesophageal reflux disease)    Symptoms remain well-controlled with Pepcid and Protonix.  No medication changes are indicated today.      Diabetes mellitus without complication (HCC) - Primary (Chronic)    Diet controlled.  Last A1c 6.4. -Diabetic foot exam completed today.  Podiatry referral placed for treatment of overgrown toenails. -Ophthalmology referral placed for diabetic eye exam.      Hyperlipidemia associated with type 2 diabetes mellitus (HCC)    Currently prescribed atorvastatin 20 mg daily.  Repeat lipid panel ordered today.      Anxiety and depression    Mood remains stable with Paxil 20 mg daily.  No medication changes are indicated today.      Return in about 6 months (around  02/13/2023) for CPE.   Dominic Lade, MD

## 2022-08-13 NOTE — Assessment & Plan Note (Signed)
Diet controlled.  Last A1c 6.4. -Diabetic foot exam completed today.  Podiatry referral placed for treatment of overgrown toenails. -Ophthalmology referral placed for diabetic eye exam.

## 2022-08-13 NOTE — Assessment & Plan Note (Signed)
BP remains well-controlled on losartan 25 mg daily.  No medication changes are indicated today.

## 2022-08-13 NOTE — Patient Instructions (Signed)
It was a pleasure to see you today.  Thank you for giving [place] the opportunity to be involved in your care.  Below is a brief recap of your visit and next steps.  We will plan to see you again in 6 months.  Summary No medication changes today Complete previously ordered labs today Podiatry and ophthalmology referrals placed Follow up in 6 months

## 2022-08-13 NOTE — Assessment & Plan Note (Signed)
Mood remains stable with Paxil 20 mg daily.  No medication changes are indicated today.

## 2022-08-14 ENCOUNTER — Other Ambulatory Visit: Payer: Self-pay

## 2022-08-14 DIAGNOSIS — R748 Abnormal levels of other serum enzymes: Secondary | ICD-10-CM

## 2022-08-14 LAB — CBC WITH DIFFERENTIAL/PLATELET
Basophils Absolute: 0 10*3/uL (ref 0.0–0.2)
Basos: 1 %
EOS (ABSOLUTE): 0.7 10*3/uL — ABNORMAL HIGH (ref 0.0–0.4)
Eos: 11 %
Hematocrit: 36.4 % — ABNORMAL LOW (ref 37.5–51.0)
Hemoglobin: 11.8 g/dL — ABNORMAL LOW (ref 13.0–17.7)
Immature Grans (Abs): 0 10*3/uL (ref 0.0–0.1)
Immature Granulocytes: 0 %
Lymphocytes Absolute: 0.9 10*3/uL (ref 0.7–3.1)
Lymphs: 15 %
MCH: 27.4 pg (ref 26.6–33.0)
MCHC: 32.4 g/dL (ref 31.5–35.7)
MCV: 85 fL (ref 79–97)
Monocytes Absolute: 0.6 10*3/uL (ref 0.1–0.9)
Monocytes: 10 %
Neutrophils Absolute: 3.8 10*3/uL (ref 1.4–7.0)
Neutrophils: 63 %
Platelets: 312 10*3/uL (ref 150–450)
RBC: 4.31 x10E6/uL (ref 4.14–5.80)
RDW: 13.9 % (ref 11.6–15.4)
WBC: 6.1 10*3/uL (ref 3.4–10.8)

## 2022-08-14 LAB — CMP14+EGFR
ALT: 32 IU/L (ref 0–44)
AST: 31 IU/L (ref 0–40)
Albumin/Globulin Ratio: 1.3 (ref 1.2–2.2)
Albumin: 4.1 g/dL (ref 3.7–4.7)
Alkaline Phosphatase: 158 IU/L — ABNORMAL HIGH (ref 44–121)
BUN/Creatinine Ratio: 17 (ref 10–24)
BUN: 16 mg/dL (ref 8–27)
Bilirubin Total: 0.5 mg/dL (ref 0.0–1.2)
CO2: 24 mmol/L (ref 20–29)
Calcium: 9.5 mg/dL (ref 8.6–10.2)
Chloride: 100 mmol/L (ref 96–106)
Creatinine, Ser: 0.92 mg/dL (ref 0.76–1.27)
Globulin, Total: 3.2 g/dL (ref 1.5–4.5)
Glucose: 115 mg/dL — ABNORMAL HIGH (ref 70–99)
Potassium: 4.4 mmol/L (ref 3.5–5.2)
Sodium: 138 mmol/L (ref 134–144)
Total Protein: 7.3 g/dL (ref 6.0–8.5)
eGFR: 83 mL/min/{1.73_m2} (ref >59)

## 2022-08-14 LAB — LIPID PANEL
Chol/HDL Ratio: 2.6 ratio (ref 0.0–5.0)
Cholesterol, Total: 131 mg/dL (ref 100–199)
HDL: 51 mg/dL (ref >39)
LDL Chol Calc (NIH): 63 mg/dL (ref 0–99)
Triglycerides: 88 mg/dL (ref 0–149)
VLDL Cholesterol Cal: 17 mg/dL (ref 5–40)

## 2022-08-14 LAB — VITAMIN D 25 HYDROXY (VIT D DEFICIENCY, FRACTURES): Vit D, 25-Hydroxy: 77.3 ng/mL (ref 30.0–100.0)

## 2022-08-14 LAB — B12 AND FOLATE PANEL
Folate: 19.5 ng/mL (ref >3.0)
Vitamin B-12: >2000 pg/mL — ABNORMAL HIGH (ref 232–1245)

## 2022-08-14 LAB — TSH: TSH: 1.7 u[IU]/mL (ref 0.450–4.500)

## 2022-08-26 ENCOUNTER — Ambulatory Visit: Payer: Medicare Other | Admitting: Podiatry

## 2022-08-26 DIAGNOSIS — B351 Tinea unguium: Secondary | ICD-10-CM | POA: Diagnosis not present

## 2022-08-26 DIAGNOSIS — M79675 Pain in left toe(s): Secondary | ICD-10-CM

## 2022-08-26 DIAGNOSIS — E119 Type 2 diabetes mellitus without complications: Secondary | ICD-10-CM

## 2022-08-26 DIAGNOSIS — M79674 Pain in right toe(s): Secondary | ICD-10-CM

## 2022-08-26 MED ORDER — CICLOPIROX 8 % EX SOLN: Freq: Every day | CUTANEOUS | 0 refills | Status: DC

## 2022-08-26 NOTE — Progress Notes (Signed)
  Subjective:  Patient ID: Dominic [name], male    DOB: [birth year],   MRN: [id number]  Chief Complaint  Patient presents with   Diabetes    Diabetic foot exam    Nail Problem     Left great toe possible fungus    82 y.o. male presents for concern of thickened elongated and painful nails that are difficult to trim. Requesting to have them trimmed today. Relates burning and tingling in their feet. Patient is diabetic and last A1c was  Lab Results  Component Value Date   HGBA1C 6.4 (H) 05/12/2022   .   PCP:  Billie Lade, MD    . Denies any other pedal complaints. Denies n/v/f/c.   Past Medical History:  Diagnosis Date   Asbestos exposure    Diabetes mellitus without complication (HCC)    Emphysema lung (HCC)     Objective:  Physical Exam: Vascular: DP/PT pulses 2/4 bilateral. CFT <3 seconds. Absent hair growth on digits. Edema noted to bilateral lower extremities. Xerosis noted bilaterally.  Skin. No lacerations or abrasions bilateral feet. Nails 1-5 bilateral  are thickened discolored and elongated with subungual debris.  Musculoskeletal: MMT 5/5 bilateral lower extremities in DF, PF, Inversion and Eversion. Deceased ROM in DF of ankle joint.  Neurological: Sensation intact to light touch. Protective sensation diminished bilateral.    Assessment:   1. Pain due to onychomycosis of toenails of both feet   2. Diabetes mellitus without complication (HCC)      Plan:  Patient was evaluated and treated and all questions answered. -Discussed and educated patient on diabetic foot care, especially with  regards to the vascular, neurological and musculoskeletal systems.  -Stressed the importance of good glycemic control and the detriment of not  controlling glucose levels in relation to the foot. -Discussed supportive shoes at all times and checking feet regularly.  -Mechanically debrided all nails 1-5 bilateral using sterile nail nipper and filed with dremel without incident   -Penlac sent to pharmacy  -Answered all patient questions -Patient to return  in 3 months for at risk foot care -Patient advised to call the office if any problems or questions arise in the meantime.   Louann Sjogren, DPM

## 2022-08-28 DIAGNOSIS — R748 Abnormal levels of other serum enzymes: Secondary | ICD-10-CM | POA: Diagnosis not present

## 2022-08-29 LAB — CMP14+EGFR
ALT: 21 IU/L (ref 0–44)
AST: 27 IU/L (ref 0–40)
Albumin/Globulin Ratio: 1.1
Albumin: 4.1 g/dL (ref 3.7–4.7)
Alkaline Phosphatase: 150 IU/L — ABNORMAL HIGH (ref 44–121)
BUN/Creatinine Ratio: 15 (ref 10–24)
BUN: 13 mg/dL (ref 8–27)
Bilirubin Total: 0.6 mg/dL (ref 0.0–1.2)
CO2: 24 mmol/L (ref 20–29)
Calcium: 9.6 mg/dL (ref 8.6–10.2)
Chloride: 98 mmol/L (ref 96–106)
Creatinine, Ser: 0.86 mg/dL (ref 0.76–1.27)
Globulin, Total: 3.7 g/dL (ref 1.5–4.5)
Glucose: 124 mg/dL — ABNORMAL HIGH (ref 70–99)
Potassium: 4.5 mmol/L (ref 3.5–5.2)
Sodium: 137 mmol/L (ref 134–144)
Total Protein: 7.8 g/dL (ref 6.0–8.5)
eGFR: 86 mL/min/{1.73_m2} (ref >59)

## 2022-08-29 LAB — GAMMA GT: GGT: 83 IU/L — ABNORMAL HIGH (ref 0–65)

## 2022-08-31 ENCOUNTER — Other Ambulatory Visit: Payer: Self-pay | Admitting: Internal Medicine

## 2022-08-31 DIAGNOSIS — R748 Abnormal levels of other serum enzymes: Secondary | ICD-10-CM

## 2022-09-10 ENCOUNTER — Other Ambulatory Visit: Payer: Self-pay | Admitting: Internal Medicine

## 2022-09-14 ENCOUNTER — Ambulatory Visit (HOSPITAL_COMMUNITY)
Admission: RE | Admit: 2022-09-14 | Discharge: 2022-09-14 | Disposition: A | Discharge status: Home / Self Care | Payer: Medicare Other | Source: Ambulatory Visit | Attending: Internal Medicine | Admitting: Internal Medicine

## 2022-09-14 DIAGNOSIS — R748 Abnormal levels of other serum enzymes: Secondary | ICD-10-CM | POA: Diagnosis not present

## 2022-09-14 DIAGNOSIS — R7989 Other specified abnormal findings of blood chemistry: Secondary | ICD-10-CM | POA: Diagnosis not present

## 2022-11-02 NOTE — Progress Notes (Unsigned)
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 Fine when resting. Left side of head stays stopped up and drains and makes him have dry cough. Worked in [employer] since 11th grade and was around [employer].  Dyspnea:  Can't do a whole [employer] / one flight of steps is difficult / mb is 200 ft flat s stopping with sats never lower than 93%  Cough: hoarse, sensation of throat tickles x one year  On acei no mucus prod Sleep: nose stopped up / on back bed is flat/ one pillow  SABA use:  Albuterol tid not prn - not sure it helps rec Only use your albuterol as a rescue medication  Also ok  Try albuterol 15 min before an activity  Make sure you check your oxygen saturations at highest level of activity to be sure it stays over 90% and keep track of it at least once a week, more often if breathing getting worse, and let me know if losing ground.  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 He is using his albuterol inhaler 10 x daily on average.   Dyspnea: much worse x one  week Cough: assoc with brown mucus   no assoc fever,  SABA use: as  above / last used it 30 min prior x 2 puffs, had not been using at all prior to a week prior to OV   02: none / sats  Covid status: vax x 3 rec Plan A = Automatic = Always=    Breztri Take 2 puffs first thing in am and then another 2 puffs about 12 hours later.  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 He has SOB walking lobby to exam room today.   Dyspnea:  walking at mall x one cirlce x 20 min with lowest sats 85%  Cough: some in am's > brownish yellow x month assoc with nasal congestion  Sleeping: bed  block x 6 in / no resp cc  SABA use: none  02: none   Rec We will walk to qualify you for ambulatory 0xygen > ok to refer for POC eligibility  Make sure you check your oxygen saturation  AT  your highest level of activity (not after you stop)   to be sure it stays over 90%  Omnicef 300 mg twice daily x 10 days should eliminate the brown mucus  For cough / congestion > mucinex dm up to 1200 mg every 12 hours as needed   Please schedule a follow up visit in 3 months but call sooner if needed    11/03/2022  f/u ov/[name] [name] ZO:XWRU 0 COPD /asbestos plaques maint on no rx   Chief Complaint  Patient presents with   Asbestosis  Dyspnea:  walks in [place] x 45 min stop once says  slower pace than others  but that was not the case on walk in [name] same date  Cough: resolved, nasal  Sleeping: bed blocks no resp cc once asleep SABA use: none  02: none   No  obvious day to day or daytime variability or assoc excess/ purulent sputum or mucus plugs or hemoptysis or cp or chest tightness, subjective wheeze or overt sinus or hb symptoms.    Also denies any obvious fluctuation of symptoms with weather or environmental changes or other aggravating or alleviating factors except as outlined above   No unusual exposure hx or h/o childhood pna/ asthma or knowledge of premature birth.  Current Allergies, Complete Past Medical History, Past Surgical History, Family History, and Social History were reviewed in [employer] record.  ROS  The following are not active complaints unless bolded Hoarseness, sore throat, dysphagia, dental problems, itching, sneezing,  nasal congestion or discharge of excess mucus or purulent secretions, ear ache,   fever, chills, sweats, unintended wt loss or wt gain, classically pleuritic or exertional cp,  orthopnea pnd or arm/hand swelling  or leg swelling, presyncope, palpitations, abdominal pain, anorexia, nausea, vomiting, diarrhea  or change in bowel habits or change in bladder habits, change in stools or change in urine, dysuria, hematuria,  rash, arthralgias, visual complaints, headache, numbness, weakness or ataxia or problems with walking or coordination,  change in mood or  memory.        Current Meds  Medication Sig   Ascorbic Acid (VITAMIN C) 100 MG tablet Take 1,000 mg by mouth daily.   atorvastatin (LIPITOR) 20 MG tablet Take 20 mg by mouth daily.   B Complex Vitamins (B COMPLEX 100 PO) Take 1 tablet by mouth daily.   budesonide (PULMICORT) 0.5 MG/2ML nebulizer solution Take 0.5 mg by nebulization 2 (two) times daily.   Cholecalciferol ([employer] VIT D 5000 HIGH-POTENCY PO) Take 1 tablet by mouth daily.   ciclopirox (PENLAC) 8 % solution Apply topically at bedtime. Apply over nail and surrounding skin. Apply daily over previous coat. After seven (7) days, may remove with alcohol and continue cycle.    coenzyme Q10-levOCARNitine (CO Q-10 PLUS) 100-20 MG CAPS Take 200 mg by mouth daily.   famotidine (PEPCID) 20 MG tablet TAKE 1 TABLET BY MOUTH AFTER  SUPPER   ipratropium (ATROVENT) 0.06 % nasal spray Place 2 sprays into both nostrils 4 (four) times daily as needed for rhinitis.   loperamide (IMODIUM A-D) 2 MG tablet Take 2 mg by mouth 4 (four) times daily as needed for diarrhea or loose stools.   losartan (COZAAR) 25 MG tablet Take  1 tablet (25 mg total) by mouth daily.   Magnesium 400 MG CAPS Take by mouth.   pantoprazole (PROTONIX) 40 MG tablet TAKE 1 TABLET BY MOUTH DAILY 30  TO 60 MINUTES BEFORE FIRST MEAL  OF THE DAY   PARoxetine (PAXIL) 20 MG tablet Take 20 mg by mouth daily.   triamcinolone (NASACORT) 55 MCG/ACT AERO nasal inhaler Place 2 sprays into the nose daily.   VITAMIN A PO Take 1 tablet by mouth daily.   vitamin B-12 (CYANOCOBALAMIN) 500 MCG tablet Take 500 mcg by mouth daily.   vitamin E 180 MG (400 UNITS) capsule Take 400 Units by mouth daily.   [DISCONTINUED] metFORMIN (GLUCOPHAGE) 500 MG tablet Take 500 mg by mouth 2 (two) times daily.             Past Medical History:  Diagnosis Date   Asbestos exposure    Diabetes mellitus without complication (HCC)    Emphysema lung (HCC)      Objective:    Wt  11/03/2022       161  08/03/2022       159  04/28/2022       161 10/28/2021       165  07/29/2021       163   04/29/2021       178 03/14/2021     189  07/15/2020         193 06/18/2020         191  02/15/20 189 lb (85.7 kg)  11/10/19 185 lb (83.9 kg)  10/05/19 200 lb (90.7 kg)    Vital signs reviewed  11/03/2022  - Note at rest 02 sats  91% on RA   General appearance:    pleasant slt hoarse amb wm nad    HEENT : Oropharynx  clear       NECK :  without  apparent JVD/ palpable Nodes/TM    LUNGS: no acc muscle use,  Nl contour chest mild coarese basilar insp crackles  bilaterally with dry  cough on insp     CV:  RRR  no s3 or murmur or increase in P2, and no  edema   ABD:  soft and nontender with nl inspiratory excursion in the supine position. No bruits or organomegaly appreciated   MS:  Nl gait/ ext warm without deformities Or obvious joint restrictions  calf tenderness, cyanosis or clubbing    SKIN: warm and dry without lesions    NEURO:  alert, approp, nl sensorium with  no motor or cerebellar deficits apparent.             Assessment

## 2022-11-03 ENCOUNTER — Encounter: Payer: Self-pay | Admitting: Internal Medicine

## 2022-11-03 ENCOUNTER — Ambulatory Visit: Payer: Medicare Other | Admitting: Internal Medicine

## 2022-11-03 VITALS — BP 103/58 | HR 85 | Ht 71.0 in | Wt 161.0 lb

## 2022-11-03 DIAGNOSIS — M2010 Hallux valgus (acquired), unspecified foot: Secondary | ICD-10-CM | POA: Insufficient documentation

## 2022-11-03 DIAGNOSIS — M722 Plantar fascial fibromatosis: Secondary | ICD-10-CM | POA: Insufficient documentation

## 2022-11-03 DIAGNOSIS — J61 Pneumoconiosis due to asbestos and other mineral fibers: Secondary | ICD-10-CM | POA: Diagnosis not present

## 2022-11-03 DIAGNOSIS — M7730 Calcaneal spur, unspecified foot: Secondary | ICD-10-CM | POA: Insufficient documentation

## 2022-11-03 NOTE — Assessment & Plan Note (Signed)
Long h/o exposure hx including working for [employer] in [place] in the ? 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 No definitive imaging findings to suggest asbestosis at this time. 2. Small pulmonary nodules scattered throughout the lungs bilaterally, stable compared to prior examinations, largest of which has a mean diameter of 8 mm in the left lower lobe. Non-contrast chest CT at 12 months is recommended> placed in reminder file for 02/04/21 CT 02/17/21  1. Asbestos related pleural disease. Thin rind of loculated pleural fluid bilaterally, similar on the right and new on the left. Associated basilar predominant parenchymal banding and volume loss, progressive from 04/27/2019. Findings may be due to asbestosis. If further evaluation is desired, high-resolution chest CT without contrast is recommended in future evaluation. 2. Pulmonary nodules are unchanged from 04/27/2019 and considered benign. 3. Cirrhosis. cxr report 07/29/21 R loculated effusion with atx RLL  - 07/29/2021   Walked on RA  x  3  lap(s) =  approx 450  ft  @ fast pace, stopped due to end of study s sob  with lowest 02 sats 91%  - 10/28/2021   Walked on RA  x  3  lap(s) =  approx 450  ft  @ moderate pace, stopped due to end of study  with lowest 02 sats 92% and no sob   - 08/03/2022   Walked on RA  x  3  lap(s) =  approx 450  ft  @ mod   pace, stopped due to end of study  with lowest 02 sats 93% and min sob  - 11/03/2022   Walked on RA  x  3  lap(s) =  approx 450  ft  @ brisk pace, stopped due to end of study  with lowest 02 sats 93% and no sob    At this point no need for 02 but would benefit from more sustained sub max ex eg treadmill or recumbent bicycle so ok to proceed with gym membership from my perspective BUT  Advised :  Make sure you check your oxygen saturation at your highest level of  activity(NOT after you stop)  to be sure it stays over 90% and keep track of it at least once a week, more often if breathing getting worse, and let me know if losing ground. (Collect the dots to connect the dots approach)    Each maintenance medication was reviewed in detail including emphasizing most importantly the difference between maintenance and prns and under what circumstances the prns are to be triggered using an action plan format where appropriate.  Total time for H and P, chart review, counseling,  directly observing portions of ambulatory 02 saturation study/ and generating customized AVS unique to this office visit / same day charting = 25 min

## 2022-11-03 NOTE — Patient Instructions (Addendum)
Mucinex dm 1200 mg every  12 hours as needed for cough  Make sure you check your oxygen saturation at your highest level of activity(NOT after you stop)  to be sure it stays over 90% and keep track of it at least once a week, more often if breathing getting worse, and let me know if losing ground. (Collect the dots to connect the dots approach)     Please schedule a follow up visit in 6  months but call sooner if needed

## 2022-11-26 ENCOUNTER — Ambulatory Visit: Payer: Medicare Other | Admitting: Podiatry

## 2022-11-26 ENCOUNTER — Encounter: Payer: Self-pay | Admitting: Podiatry

## 2022-11-26 DIAGNOSIS — M79675 Pain in left toe(s): Secondary | ICD-10-CM

## 2022-11-26 DIAGNOSIS — B351 Tinea unguium: Secondary | ICD-10-CM | POA: Diagnosis not present

## 2022-11-26 DIAGNOSIS — M79674 Pain in right toe(s): Secondary | ICD-10-CM | POA: Diagnosis not present

## 2022-11-26 DIAGNOSIS — L603 Nail dystrophy: Secondary | ICD-10-CM

## 2022-11-26 DIAGNOSIS — E119 Type 2 diabetes mellitus without complications: Secondary | ICD-10-CM | POA: Diagnosis not present

## 2022-11-26 NOTE — Progress Notes (Signed)
This patient returns to my office for at risk foot care.  This patient requires this care by a professional since this patient will be at risk due to having diabetes.  This patient is unable to cut nails himself since the patient cannot reach his nails.These nails are painful walking and wearing shoes.  He has yellow disfigured nail left big toe.  He was given penlac previously but he sees no improvement. This patient presents for at risk foot care today.  General Appearance  Alert, conversant and in no acute stress.  Vascular  Dorsalis pedis and posterior tibial  pulses are palpable  bilaterally.  Capillary return is within normal limits  bilaterally. Temperature is within normal limits  bilaterally.  Neurologic  Senn-Weinstein monofilament wire test within normal limits  bilaterally. Muscle power within normal limits bilaterally.  Nails Thick disfigured discolored nails with subungual debris  from hallux to fifth toes bilaterally. No evidence of bacterial infection or drainage bilaterally. His left hallux toenail left foot is yellow, disfigured and unattached to nail bed distally.  Orthopedic  No limitations of motion  feet .  No crepitus or effusions noted.  No bony pathology or digital deformities noted.  Skin  normotropic skin with no porokeratosis noted bilaterally.  No signs of infections or ulcers noted.     Onychomycosis  Pain in right toes  Pain in left toes  Nail Dystrophy left hallux toenail.  Consent was obtained for treatment procedures.   Mechanical debridement of nails 1-5  bilaterally performed with a nail nipper.  Filed with dremel without incident.    Return office visit    3 months                  Told patient to return for periodic foot care and evaluation due to potential at risk complications.  Cauterized left hallux nail bed.  DSD applied.   Helane Gunther DPM

## 2023-02-03 ENCOUNTER — Other Ambulatory Visit: Payer: Self-pay | Admitting: Internal Medicine

## 2023-02-15 ENCOUNTER — Ambulatory Visit (INDEPENDENT_AMBULATORY_CARE_PROVIDER_SITE_OTHER): Payer: Medicare Other | Admitting: Internal Medicine

## 2023-02-15 ENCOUNTER — Encounter: Payer: Self-pay | Admitting: Internal Medicine

## 2023-02-15 VITALS — Ht 71.0 in | Wt 160.8 lb

## 2023-02-15 DIAGNOSIS — F419 Anxiety disorder, unspecified: Secondary | ICD-10-CM | POA: Diagnosis not present

## 2023-02-15 DIAGNOSIS — K219 Gastro-esophageal reflux disease without esophagitis: Secondary | ICD-10-CM

## 2023-02-15 DIAGNOSIS — E119 Type 2 diabetes mellitus without complications: Secondary | ICD-10-CM

## 2023-02-15 DIAGNOSIS — Z0001 Encounter for general adult medical examination with abnormal findings: Secondary | ICD-10-CM

## 2023-02-15 DIAGNOSIS — I10 Essential (primary) hypertension: Secondary | ICD-10-CM

## 2023-02-15 DIAGNOSIS — E785 Hyperlipidemia, unspecified: Secondary | ICD-10-CM | POA: Diagnosis not present

## 2023-02-15 DIAGNOSIS — Z23 Encounter for immunization: Secondary | ICD-10-CM

## 2023-02-15 DIAGNOSIS — R42 Dizziness and giddiness: Secondary | ICD-10-CM

## 2023-02-15 DIAGNOSIS — F32A Depression, unspecified: Secondary | ICD-10-CM

## 2023-02-15 DIAGNOSIS — E1169 Type 2 diabetes mellitus with other specified complication: Secondary | ICD-10-CM

## 2023-02-15 DIAGNOSIS — D539 Nutritional anemia, unspecified: Secondary | ICD-10-CM | POA: Diagnosis not present

## 2023-02-15 DIAGNOSIS — E559 Vitamin D deficiency, unspecified: Secondary | ICD-10-CM | POA: Diagnosis not present

## 2023-02-15 MED ORDER — ATORVASTATIN CALCIUM 20 MG PO TABS: 20.0000 mg | ORAL_TABLET | Freq: Every day | ORAL | 3 refills | Status: DC

## 2023-02-15 MED ORDER — PANTOPRAZOLE SODIUM 40 MG PO TBEC: DELAYED_RELEASE_TABLET | ORAL | 2 refills | Status: DC

## 2023-02-15 MED ORDER — PAROXETINE HCL 30 MG PO TABS: 30.0000 mg | ORAL_TABLET | Freq: Every day | ORAL | 2 refills | Status: DC

## 2023-02-15 NOTE — Addendum Note (Signed)
Addended by: Christel Mormon E on: 02/15/2023 12:57 PM   Modules accepted: Level of Service

## 2023-02-15 NOTE — Assessment & Plan Note (Signed)
Remains adequately controlled with losartan 25 mg daily.  Orthostatic vital signs in office today were negative.

## 2023-02-15 NOTE — Assessment & Plan Note (Signed)
Diet controlled.  A1c 6.4 on labs from February.  Repeat A1c ordered today.

## 2023-02-15 NOTE — Patient Instructions (Signed)
It was a pleasure to see you today.  Thank you for giving [place] the opportunity to be involved in your care.  Below is a brief recap of your visit and next steps.  We will plan to see you again in 3 months.  Summary Annual physical completed today Repeat labs ordered Increase Paxil to 30 mg daily Echocardiogram ordered for dizziness evaluation Follow up in 3 months

## 2023-02-15 NOTE — Assessment & Plan Note (Signed)
Symptoms remain adequately controlled with Pepcid and Protonix.  No medication changes are indicated today.

## 2023-02-15 NOTE — Assessment & Plan Note (Signed)
Presenting today for annual physical.  Previous records and labs reviewed. -Repeat labs ordered today -Flu shot administered today -Increasing Paxil to 30 mg nightly -Echocardiogram ordered for evaluation of dizziness -We will tentatively plan for follow-up in 3 months

## 2023-02-15 NOTE — Assessment & Plan Note (Signed)
His acute concern today is dizziness.  His symptoms are triggered by standing from a seated position.  Dizziness does not improve with standing still and persists as long as he is on his feet.  Symptoms are alleviated by sitting.  He additionally endorses increasing dyspnea with exertion.  No nystagmus appreciated on exam today.  Orthostatic vital signs obtained in office were negative.  ECG shows normal sinus rhythm.  No murmurs appreciated on exam, however given symptoms of presyncope and dyspnea with exertion, echocardiogram ordered today for further evaluation.

## 2023-02-15 NOTE — Assessment & Plan Note (Signed)
Lipid panel updated in May.  Total cholesterol 131 and LDL 63.  He is currently prescribed atorvastatin 20 mg daily.

## 2023-02-15 NOTE — Progress Notes (Signed)
Complete physical exam  Patient: Dominic [name]   DOB: 1940-08-17   82 y.o. Male  MRN: [id number]  Subjective:    Chief Complaint  Patient presents with   Annual Exam   swimmy headed    Patient feels while standing he is swimmy headed    Insomnia    Not sleeping     Dominic [name] is a 82 y.o. male who presents today for a complete physical exam. He reports consuming a general diet. Exercise is limited by respiratory condition(s): COPD. He generally feels poorly. He reports sleeping poorly. He does have additional problems to discuss today.  His additional concerns today are dizziness and insomnia.  Dominic [name] describes a recent history of dizziness upon standing.  He describes feeling as though he is going to pass out, but states that there is no change in his vision.  Symptoms do not improve if he stands still and persist as long as he is standing.  Dizziness resolves upon sitting.  He regularly ambulates around the mall in [place] but endorses increasing shortness of breath.  He states that he was previously able to walk 2 laps without stopping, but now is having to take 4-5 breaks to walk the same distance.  He denies further symptoms such as orthopnea/PND and lower extremity edema.  Dominic [name] is sleeping poorly because he feels as though his mind is racing.  He is interested in additional treatment options.   Most recent fall risk assessment:    02/15/2023    9:04 AM  Fall Risk   Falls in the past year? 0  Number falls in past yr: 0  Injury with Fall? 0  Risk for fall due to : No Fall Risks  Follow up Falls evaluation completed     Most recent depression screenings:    02/15/2023    9:05 AM 08/13/2022    8:33 AM  PHQ 2/9 Scores  PHQ - 2 Score 0 0  PHQ- 9 Score 6     Vision:Not within last year  and Dental: No current dental problems  Patient Active Problem List   Diagnosis Date Noted   Dizziness 02/15/2023   Need for influenza vaccination 02/15/2023   Encounter  for well adult exam with abnormal findings 02/15/2023   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   Calcaneal spur 11/03/2022   Hallux valgus 11/03/2022   Plantar fasciitis 11/03/2022   Hyperlipidemia associated with type 2 diabetes mellitus (HCC) 05/12/2022   GERD (gastroesophageal reflux disease) 05/12/2022   Anxiety and depression 05/12/2022   History of diabetes mellitus 05/12/2022   Encounter for general adult medical examination with abnormal findings 05/12/2022   COPD with acute exacerbation (HCC) 06/18/2020   Nasal polyp 02/26/2020   DOE (dyspnea on exertion) 02/15/2020   Chronic sinusitis 02/15/2020   Essential hypertension 11/11/2019   CAP (community acquired pneumonia) 10/05/2019   Diabetes mellitus without complication (HCC)    COPD GOLD 0     Acute respiratory failure with hypoxia (HCC)    Asbestosis (HCC)    Sepsis due to pneumonia (HCC)    Lactic acidosis    Hyponatremia    Hypotension    Past Medical History:  Diagnosis Date   Asbestos exposure    Diabetes mellitus without complication (HCC)    Emphysema lung (HCC)    Past Surgical History:  Procedure Laterality Date   APPENDECTOMY     Social History   Tobacco Use   Smoking status: Former    Current packs/day: 0.00  Types: Cigarettes    Quit date: 1980    Years since quitting: 44.9   Smokeless tobacco: Never  Vaping Use   Vaping status: Never Used  Substance Use Topics   Alcohol use: Never   Drug use: Never   Family Status  Relation Name Status   Mother  Deceased   Father  Deceased   Brother  Deceased   Brother  Alive  No partnership data on file   Family History  Problem Relation Age of Onset   Lung cancer Mother    Lung cancer Father    Kidney failure Brother    No Known Allergies    Patient Care Team: Billie Lade, MD as PCP - General (Internal Medicine)   Outpatient Medications Prior to Visit  Medication Sig   Ascorbic Acid (VITAMIN C) 100 MG tablet Take 1,000 mg by mouth daily.   B  Complex Vitamins (B COMPLEX 100 PO) Take 1 tablet by mouth daily.   budesonide (PULMICORT) 0.5 MG/2ML nebulizer solution Take 0.5 mg by nebulization 2 (two) times daily.   Cholecalciferol (CVS VIT D 5000 HIGH-POTENCY PO) Take 1 tablet by mouth daily.   ciclopirox (PENLAC) 8 % solution Apply topically at bedtime. Apply over nail and surrounding skin. Apply daily over previous coat. After seven (7) days, may remove with alcohol and continue cycle.   coenzyme Q10-levOCARNitine (CO Q-10 PLUS) 100-20 MG CAPS Take 200 mg by mouth daily.   famotidine (PEPCID) 20 MG tablet TAKE 1 TABLET BY MOUTH AFTER  SUPPER   ipratropium (ATROVENT) 0.06 % nasal spray Place 2 sprays into both nostrils 4 (four) times daily as needed for rhinitis.   loperamide (IMODIUM A-D) 2 MG tablet Take 2 mg by mouth 4 (four) times daily as needed for diarrhea or loose stools.   losartan (COZAAR) 25 MG tablet Take 1 tablet (25 mg total) by mouth daily.   Magnesium 400 MG CAPS Take by mouth.   triamcinolone (NASACORT) 55 MCG/ACT AERO nasal inhaler Place 2 sprays into the nose daily.   VITAMIN A PO Take 1 tablet by mouth daily.   vitamin B-12 (CYANOCOBALAMIN) 500 MCG tablet Take 500 mcg by mouth daily.   vitamin E 180 MG (400 UNITS) capsule Take 400 Units by mouth daily.   [DISCONTINUED] atorvastatin (LIPITOR) 20 MG tablet Take 20 mg by mouth daily.   [DISCONTINUED] pantoprazole (PROTONIX) 40 MG tablet TAKE 1 TABLET BY MOUTH DAILY 30  TO 60 MINUTES BEFORE FIRST MEAL  OF THE DAY   [DISCONTINUED] PARoxetine (PAXIL) 20 MG tablet Take 20 mg by mouth daily.   No facility-administered medications prior to visit.   Review of Systems  Constitutional:  Negative for chills and fever.  HENT:  Negative for sore throat.   Respiratory:  Negative for cough and shortness of breath.   Cardiovascular:  Negative for chest pain, palpitations and leg swelling.  Gastrointestinal:  Negative for abdominal pain, blood in stool, constipation, diarrhea,  nausea and vomiting.  Genitourinary:  Negative for dysuria and hematuria.  Musculoskeletal:  Negative for myalgias.  Skin:  Negative for itching and rash.  Neurological:  Positive for dizziness. Negative for headaches.  Psychiatric/Behavioral:  Negative for depression and suicidal ideas. The patient has insomnia.       Objective:     Ht 5\' 11"  (1.803 m)   Wt 160 lb 12.8 oz (72.9 kg)   SpO2 93%   BMI 22.43 kg/m  BP Readings from Last 3 Encounters:  11/03/22 (!) 103/58  08/13/22 116/71  08/03/22 108/64   Wt Readings from Last 3 Encounters:  02/15/23 160 lb 12.8 oz (72.9 kg)  11/03/22 161 lb (73 kg)  08/13/22 164 lb (74.4 kg)   Physical Exam Vitals reviewed.  Constitutional:      General: He is not in acute distress.    Appearance: Normal appearance. He is not ill-appearing.  HENT:     Head: Normocephalic and atraumatic.     Right Ear: External ear normal.     Left Ear: External ear normal.     Nose: Nose normal. No congestion or rhinorrhea.     Mouth/Throat:     Mouth: Mucous membranes are moist.     Pharynx: Oropharynx is clear.  Eyes:     General: No scleral icterus.    Extraocular Movements: Extraocular movements intact.     Conjunctiva/sclera: Conjunctivae normal.     Pupils: Pupils are equal, round, and reactive to light.  Cardiovascular:     Rate and Rhythm: Normal rate and regular rhythm.     Pulses: Normal pulses.     Heart sounds: Normal heart sounds. No murmur heard. Pulmonary:     Effort: Pulmonary effort is normal.     Breath sounds: Normal breath sounds. No wheezing, rhonchi or rales.  Abdominal:     General: Abdomen is flat. Bowel sounds are normal. There is no distension.     Palpations: Abdomen is soft.     Tenderness: There is no abdominal tenderness.  Musculoskeletal:        General: No swelling or deformity. Normal range of motion.     Cervical back: Normal range of motion.  Skin:    General: Skin is warm and dry.     Capillary Refill:  Capillary refill takes less than 2 seconds.  Neurological:     General: No focal deficit present.     Mental Status: He is alert and oriented to person, place, and time.     Motor: No weakness.  Psychiatric:        Mood and Affect: Mood normal.        Behavior: Behavior normal.        Thought Content: Thought content normal.   Last CBC Lab Results  Component Value Date   WBC 6.1 08/13/2022   HGB 11.8 (L) 08/13/2022   HCT 36.4 (L) 08/13/2022   MCV 85 08/13/2022   MCH 27.4 08/13/2022   RDW 13.9 08/13/2022   PLT 312 08/13/2022   Last metabolic panel Lab Results  Component Value Date   GLUCOSE 124 (H) 08/28/2022   NA 137 08/28/2022   K 4.5 08/28/2022   CL 98 08/28/2022   CO2 24 08/28/2022   BUN 13 08/28/2022   CREATININE 0.86 08/28/2022   EGFR 86 08/28/2022   CALCIUM 9.6 08/28/2022   PROT 7.8 08/28/2022   ALBUMIN 4.1 08/28/2022   LABGLOB 3.7 08/28/2022   AGRATIO 1.1 08/28/2022   BILITOT 0.6 08/28/2022   ALKPHOS 150 (H) 08/28/2022   AST 27 08/28/2022   ALT 21 08/28/2022   ANIONGAP 8 10/06/2019   Last lipids Lab Results  Component Value Date   CHOL 131 08/13/2022   HDL 51 08/13/2022   LDLCALC 63 08/13/2022   TRIG 88 08/13/2022   CHOLHDL 2.6 08/13/2022   Last hemoglobin A1c Lab Results  Component Value Date   HGBA1C 6.4 (H) 05/12/2022   Last thyroid functions Lab Results  Component Value Date   TSH 1.700 08/13/2022   Last vitamin  D Lab Results  Component Value Date   VD25OH 77.3 08/13/2022   Last vitamin B12 and Folate Lab Results  Component Value Date   VITAMINB12 >2000 (H) 08/13/2022   FOLATE 19.5 08/13/2022        Assessment & Plan:    Routine Health Maintenance and Physical Exam  Immunization History  Administered Date(s) Administered   Fluad Trivalent(High Dose 65+) 02/15/2023   Influenza,inj,Quad PF,6+ Mos 12/14/2020   Moderna SARS-COV2 Booster Vaccination 01/18/2020   Moderna Sars-Covid-2 Vaccination 04/19/2019, 05/17/2019   Pfizer  Covid-19 Vaccine Bivalent Booster 46yrs & up 01/16/2021    Health Maintenance  Topic Date Due   OPHTHALMOLOGY EXAM  Never done   DTaP/Tdap/Td (1 - Tdap) Never done   Zoster Vaccines- Shingrix (1 of 2) Never done   HEMOGLOBIN A1C  11/10/2022   COVID-19 Vaccine (5 - 2023-24 season) 11/15/2022   Pneumonia Vaccine 50+ Years old (1 of 1 - PCV) 06/09/2023 (Originally 08/18/2004)   Diabetic kidney evaluation - Urine ACR  05/13/2023   Medicare Annual Wellness (AWV)  06/10/2023   FOOT EXAM  08/26/2023   Diabetic kidney evaluation - eGFR measurement  08/28/2023   INFLUENZA VACCINE  Completed   HPV VACCINES  Aged Out    Discussed health benefits of physical activity, and encouraged him to engage in regular exercise appropriate for his age and condition.  Problem List Items Addressed This Visit       Essential hypertension    Remains adequately controlled with losartan 25 mg daily.  Orthostatic vital signs in office today were negative.      GERD (gastroesophageal reflux disease)    Symptoms remain adequately controlled with Pepcid and Protonix.  No medication changes are indicated today.      Diabetes mellitus without complication (HCC) (Chronic)    Diet controlled.  A1c 6.4 on labs from February.  Repeat A1c ordered today.      Hyperlipidemia associated with type 2 diabetes mellitus (HCC)    Lipid panel updated in May.  Total cholesterol 131 and LDL 63.  He is currently prescribed atorvastatin 20 mg daily.      Anxiety and depression    Currently prescribed Paxil 20 mg daily.  He endorses symptoms of insomnia today, noting that his mind is racing at night.  Treatment options were reviewed.  Paxil has been increased to 30 mg daily.  I also recommended starting melatonin 3 mg nightly.      Dizziness - Primary    His acute concern today is dizziness.  His symptoms are triggered by standing from a seated position.  Dizziness does not improve with standing still and persists as long as he  is on his feet.  Symptoms are alleviated by sitting.  He additionally endorses increasing dyspnea with exertion.  No nystagmus appreciated on exam today.  Orthostatic vital signs obtained in office were negative.  ECG shows normal sinus rhythm.  No murmurs appreciated on exam, however given symptoms of presyncope and dyspnea with exertion, echocardiogram ordered today for further evaluation.      Need for influenza vaccination    Influenza vaccine administered today      Encounter for well adult exam with abnormal findings    Presenting today for annual physical.  Previous records and labs reviewed. -Repeat labs ordered today -Flu shot administered today -Increasing Paxil to 30 mg nightly -Echocardiogram ordered for evaluation of dizziness -We will tentatively plan for follow-up in 3 months      Return in  about 3 months (around 05/16/2023).  Billie Lade, MD

## 2023-02-15 NOTE — Assessment & Plan Note (Signed)
Influenza vaccine administered today.

## 2023-02-15 NOTE — Assessment & Plan Note (Signed)
Currently prescribed Paxil 20 mg daily.  He endorses symptoms of insomnia today, noting that his mind is racing at night.  Treatment options were reviewed.  Paxil has been increased to 30 mg daily.  I also recommended starting melatonin 3 mg nightly.

## 2023-02-16 LAB — LIPID PANEL
Chol/HDL Ratio: 3.3 {ratio} (ref 0.0–5.0)
Cholesterol, Total: 143 mg/dL (ref 100–199)
HDL: 44 mg/dL (ref >39)
LDL Chol Calc (NIH): 74 mg/dL (ref 0–99)
Triglycerides: 140 mg/dL (ref 0–149)
VLDL Cholesterol Cal: 25 mg/dL (ref 5–40)

## 2023-02-16 LAB — CBC WITH DIFFERENTIAL/PLATELET
Basophils Absolute: 0 10*3/uL (ref 0.0–0.2)
Basos: 1 %
EOS (ABSOLUTE): 0.4 10*3/uL (ref 0.0–0.4)
Eos: 6 %
Hematocrit: 40.5 % (ref 37.5–51.0)
Hemoglobin: 12.9 g/dL — ABNORMAL LOW (ref 13.0–17.7)
Immature Grans (Abs): 0 10*3/uL (ref 0.0–0.1)
Immature Granulocytes: 0 %
Lymphocytes Absolute: 1 10*3/uL (ref 0.7–3.1)
Lymphs: 14 %
MCH: 28 pg (ref 26.6–33.0)
MCHC: 31.9 g/dL (ref 31.5–35.7)
MCV: 88 fL (ref 79–97)
Monocytes Absolute: 0.9 10*3/uL (ref 0.1–0.9)
Monocytes: 13 %
Neutrophils Absolute: 4.6 10*3/uL (ref 1.4–7.0)
Neutrophils: 66 %
Platelets: 303 10*3/uL (ref 150–450)
RBC: 4.6 x10E6/uL (ref 4.14–5.80)
RDW: 13.8 % (ref 11.6–15.4)
WBC: 6.9 10*3/uL (ref 3.4–10.8)

## 2023-02-16 LAB — B12 AND FOLATE PANEL
Folate: 7.1 ng/mL (ref >3.0)
Vitamin B-12: >2000 pg/mL — ABNORMAL HIGH (ref 232–1245)

## 2023-02-16 LAB — CMP14+EGFR
ALT: 30 [IU]/L (ref 0–44)
AST: 33 [IU]/L (ref 0–40)
Albumin: 4.1 g/dL (ref 3.7–4.7)
Alkaline Phosphatase: 164 [IU]/L — ABNORMAL HIGH (ref 44–121)
BUN/Creatinine Ratio: 13 (ref 10–24)
BUN: 12 mg/dL (ref 8–27)
Bilirubin Total: 0.6 mg/dL (ref 0.0–1.2)
CO2: 23 mmol/L (ref 20–29)
Calcium: 9.3 mg/dL (ref 8.6–10.2)
Chloride: 99 mmol/L (ref 96–106)
Creatinine, Ser: 0.89 mg/dL (ref 0.76–1.27)
Globulin, Total: 3.3 g/dL (ref 1.5–4.5)
Glucose: 129 mg/dL — ABNORMAL HIGH (ref 70–99)
Potassium: 5.2 mmol/L (ref 3.5–5.2)
Sodium: 138 mmol/L (ref 134–144)
Total Protein: 7.4 g/dL (ref 6.0–8.5)
eGFR: 85 mL/min/{1.73_m2} (ref >59)

## 2023-02-16 LAB — TSH+FREE T4
Free T4: 1.26 ng/dL (ref 0.82–1.77)
TSH: 3.07 u[IU]/mL (ref 0.450–4.500)

## 2023-02-16 LAB — HEMOGLOBIN A1C
Est. average glucose Bld gHb Est-mCnc: 140 mg/dL
Hgb A1c MFr Bld: 6.5 % — ABNORMAL HIGH (ref 4.8–5.6)

## 2023-02-16 LAB — VITAMIN D 25 HYDROXY (VIT D DEFICIENCY, FRACTURES): Vit D, 25-Hydroxy: 75.5 ng/mL (ref 30.0–100.0)

## 2023-02-25 ENCOUNTER — Ambulatory Visit: Payer: Medicare Other | Admitting: Podiatry

## 2023-03-02 ENCOUNTER — Other Ambulatory Visit: Payer: Self-pay | Admitting: Internal Medicine

## 2023-03-02 DIAGNOSIS — F419 Anxiety disorder, unspecified: Secondary | ICD-10-CM

## 2023-03-02 DIAGNOSIS — F32A Depression, unspecified: Secondary | ICD-10-CM

## 2023-03-18 ENCOUNTER — Encounter: Payer: Self-pay | Admitting: Podiatry

## 2023-03-18 ENCOUNTER — Ambulatory Visit (INDEPENDENT_AMBULATORY_CARE_PROVIDER_SITE_OTHER): Payer: Medicare Other | Admitting: Podiatry

## 2023-03-18 VITALS — Ht 71.0 in | Wt 160.8 lb

## 2023-03-18 DIAGNOSIS — M79675 Pain in left toe(s): Secondary | ICD-10-CM | POA: Diagnosis not present

## 2023-03-18 DIAGNOSIS — E119 Type 2 diabetes mellitus without complications: Secondary | ICD-10-CM

## 2023-03-18 DIAGNOSIS — M79674 Pain in right toe(s): Secondary | ICD-10-CM

## 2023-03-18 DIAGNOSIS — B351 Tinea unguium: Secondary | ICD-10-CM | POA: Diagnosis not present

## 2023-03-18 NOTE — Progress Notes (Addendum)
This patient returns to my office for at risk foot care.  This patient requires this care by a professional since this patient will be at risk due to having diabetes.  This patient is unable to cut nails himself since the patient cannot reach his nails.These nails are painful walking and wearing shoes.  This patient presents for at risk foot care today.  General Appearance  Alert, conversant and in no acute stress.  Vascular  Dorsalis pedis and posterior tibial  pulses are palpable  bilaterally.  Capillary return is within normal limits  bilaterally. Temperature is within normal limits  bilaterally.  Neurologic  Senn-Weinstein monofilament wire test within normal limits  bilaterally. Muscle power within normal limits bilaterally.  Nails Thick disfigured discolored nails with subungual debris  from hallux to fifth toes bilaterally. No evidence of bacterial infection or drainage bilaterally.  Orthopedic  No limitations of motion  feet .  No crepitus or effusions noted.  No bony pathology or digital deformities noted.  HAV  B/L.  Skin  normotropic skin with no porokeratosis noted bilaterally.  No signs of infections or ulcers noted.     Onychomycosis  Pain in right toes  Pain in left toes  Consent was obtained for treatment procedures.   Mechanical debridement of nails 1-5  bilaterally performed with a nail nipper.  Filed with dremel without incident.    Return office visit  4 months                    Told patient to return for periodic foot care and evaluation due to potential at risk complications.   Gardiner Barefoot DPM

## 2023-03-20 ENCOUNTER — Telehealth (HOSPITAL_BASED_OUTPATIENT_CLINIC_OR_DEPARTMENT_OTHER): Payer: Self-pay | Admitting: Pulmonary Disease

## 2023-03-20 NOTE — Telephone Encounter (Signed)
 [facility] Telephone Encounter After Hours  Patient reports mistakenly taking 20 tablets of 40 mg strength protonix  instead of his usual daily pills.  I called poison control on patient's behalf and reviewed medical history and medications.  I contacted patient with recommendations of avoiding additional protonix  or any other reflux medication for the next 7 days. I also provided patient [place] poison control hotline if he does have any questions about symptoms. No expected symptoms to monitor at this time.  He expressed appreciation for the call. Closing the encounter.

## 2023-03-24 ENCOUNTER — Telehealth: Payer: Self-pay | Admitting: Internal Medicine

## 2023-03-24 ENCOUNTER — Ambulatory Visit (HOSPITAL_COMMUNITY)
Admission: RE | Admit: 2023-03-24 | Discharge: 2023-03-24 | Disposition: A | Discharge status: Home / Self Care | Payer: Medicare Other | Source: Ambulatory Visit | Attending: Internal Medicine | Admitting: Internal Medicine

## 2023-03-24 DIAGNOSIS — I351 Nonrheumatic aortic (valve) insufficiency: Secondary | ICD-10-CM | POA: Insufficient documentation

## 2023-03-24 DIAGNOSIS — R42 Dizziness and giddiness: Secondary | ICD-10-CM | POA: Diagnosis not present

## 2023-03-24 DIAGNOSIS — R0609 Other forms of dyspnea: Secondary | ICD-10-CM

## 2023-03-24 DIAGNOSIS — E119 Type 2 diabetes mellitus without complications: Secondary | ICD-10-CM | POA: Diagnosis not present

## 2023-03-24 LAB — ECHOCARDIOGRAM COMPLETE BUBBLE STUDY
AR max vel: 2.45 cm2
AV Area VTI: 2.59 cm2
AV Area mean vel: 2.31 cm2
AV Mean grad: 4 mm[Hg]
AV Peak grad: 8 mm[Hg]
Ao pk vel: 1.41 m/s
Area-P 1/2: 3.5 cm2
S' Lateral: 3.1 cm

## 2023-03-24 NOTE — Telephone Encounter (Signed)
 Patient Called is he allow to have food or drink before this procedure echo bubble today at 2:00.  Patient will wait on call back at [phone]

## 2023-03-24 NOTE — Telephone Encounter (Signed)
 Spoke to patient

## 2023-05-08 NOTE — Progress Notes (Unsigned)
 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 Fine when resting. Left side of head stays stopped up and drains and makes him have dry cough. 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  Dyspnea:  Can't do a whole [employer] / one flight of steps is difficult / mb is 200 ft flat s stopping with sats never lower than 93%  Cough: hoarse, sensation of throat tickles x one year  On acei no mucus prod Sleep: nose stopped up / on back bed is flat/ one pillow  SABA use:  Albuterol tid not prn - not sure it helps rec Only use your albuterol as a rescue medication  Also ok  Try albuterol 15 min before an activity  Make sure you check your oxygen saturations at highest level of activity to be sure it stays over 90% and keep track of it at least once a week, more often if breathing getting worse, and let me know if losing ground.  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 He is using his albuterol inhaler 10 x daily on average.   Dyspnea: much worse x one  week Cough: assoc with brown mucus   no assoc fever,  SABA use: as  above / last used it 30 min prior x 2 puffs, had not been using at all prior to a week prior to OV   02: none / sats  Covid status: vax x 3 rec Plan A = Automatic = Always=    Breztri Take 2 puffs first thing in am and then another 2 puffs about 12 hours later.  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 He has SOB walking lobby to exam room today.   Dyspnea:  walking at mall x one cirlce x 20 min with lowest sats 85%  Cough: some in am's > brownish yellow x month assoc with nasal congestion  Sleeping: bed  block x 6 in / no resp cc  SABA use: none  02: none   Rec We will walk to qualify you for ambulatory 0xygen > ok to refer for POC eligibility  Make sure you check your oxygen saturation  AT  your highest level of activity (not after you stop)   to be sure it stays over 90%  Omnicef 300 mg twice daily x 10 days should eliminate the brown mucus  For cough / congestion > mucinex dm up to 1200 mg every 12 hours as needed   Please schedule a follow up visit in 3 months but call sooner if needed    11/03/2022  f/u ov/Dominic [name] office/Dominic [name] ZO:XWRU 0 COPD /asbestos plaques maint on no rx   Chief Complaint  Patient presents with   Asbestosis  Dyspnea:  walks in [place] x 45 min stop once says  slower pace than others  but that was not the case on walk in office same date  Cough: resolved, nasal  Sleeping: bed blocks no resp cc once asleep SABA use: none  02:  none Rec Mucinex dm 1200 mg every  12 hours as needed for cough Make sure you check your oxygen saturation at your highest level of activity(NOT after you stop)  to be sure it stays over 90%   Please schedule a follow up visit in 6  months but call sooner if needed   05/10/2023  f/u ov/Dominic [name] re: COPD GOLD 0/ CB    maint on mucinex dm   Chief Complaint  Patient presents with   Follow-up    6 month follow up  Dyspnea:  walks around rest aeas ok = baseline Cough: orange  Sleeping: bed blocks/ one pillow s  resp cc  02: none     No obvious day to day or daytime variability or assoc  mucus plugs or hemoptysis or cp or chest tightness, subjective wheeze or overt sinus or hb symptoms.    Also denies any obvious fluctuation of symptoms with weather or environmental changes or other aggravating or alleviating factors except as outlined above   No unusual exposure hx or h/o childhood pna/ asthma or knowledge of premature birth.  Current Allergies, Complete Past Medical History, Past Surgical History, Family History, and Social History were reviewed in [employer] record.  ROS  The following are not active complaints unless bolded Hoarseness, sore throat, dysphagia, dental problems, itching, sneezing,  nasal congestion or discharge of excess mucus or purulent secretions, ear ache,   fever, chills, sweats, unintended wt loss or wt gain, classically pleuritic or exertional cp,  orthopnea pnd or arm/hand swelling  or leg swelling, presyncope, palpitations, abdominal pain, anorexia, nausea, vomiting, diarrhea  or change in bowel habits or change in bladder habits, change in stools or change in urine, dysuria, hematuria,  rash, arthralgias, visual complaints, headache, numbness, weakness or ataxia or problems with walking or coordination,  change in mood or  memory.        Current Meds  Medication Sig   Ascorbic Acid (VITAMIN C) 100 MG tablet Take 1,000 mg by mouth daily.    atorvastatin (LIPITOR) 20 MG tablet Take 1 tablet (20 mg total) by mouth daily.   B Complex Vitamins (B COMPLEX 100 PO) Take 1 tablet by mouth daily.   budesonide (PULMICORT) 0.5 MG/2ML nebulizer solution Take 0.5 mg by nebulization 2 (two) times daily.   Cholecalciferol ([employer]S VIT D 5000 HIGH-POTENCY PO) Take 1 tablet by mouth daily.  ciclopirox (PENLAC) 8 % solution Apply topically at bedtime. Apply over nail and surrounding skin. Apply daily over previous coat. After seven (7) days, may remove with alcohol and continue cycle.   coenzyme Q10-levOCARNitine (CO Q-10 PLUS) 100-20 MG CAPS Take 200 mg by mouth daily.   famotidine (PEPCID) 20 MG tablet TAKE 1 TABLET BY MOUTH AFTER  SUPPER   ipratropium (ATROVENT) 0.06 % nasal spray Place 2 sprays into both nostrils 4 (four) times daily as needed for rhinitis.   loperamide (IMODIUM A-D) 2 MG tablet Take 2 mg by mouth 4 (four) times daily as needed for diarrhea or loose stools.   losartan (COZAAR) 25 MG tablet Take 1 tablet (25 mg total) by mouth daily.   Magnesium 400 MG CAPS Take by mouth.   pantoprazole (PROTONIX) 40 MG tablet TAKE 1 TABLET BY MOUTH DAILY 30  TO 60 MINUTES BEFORE FIRST MEAL  OF THE DAY   PARoxetine (PAXIL) 30 MG tablet TAKE 1 TABLET BY MOUTH DAILY   triamcinolone (NASACORT) 55 MCG/ACT AERO nasal inhaler Place 2 sprays into the nose daily.   VITAMIN A PO Take 1 tablet by mouth daily.   vitamin B-12 (CYANOCOBALAMIN) 500 MCG tablet Take 500 mcg by mouth daily.   vitamin E 180 MG (400 UNITS) capsule Take 400 Units by mouth daily.               Past Medical History:  Diagnosis Date   Asbestos exposure    Diabetes mellitus without complication (HCC)    Emphysema lung (HCC)      Objective:    Wt  05/10/2023       155  11/03/2022       161  08/03/2022       159  04/28/2022       161 10/28/2021       165  07/29/2021       163   04/29/2021       178 03/14/2021     189  07/15/2020         193 06/18/2020         191  02/15/20 189  lb (85.7 kg)  11/10/19 185 lb (83.9 kg)  10/05/19 200 lb (90.7 kg)    Vital signs reviewed  05/10/2023  - Note at rest 02 sats  94% on RA   General appearance:    tremulous amb hoarse  wm nad    HEENT : Oropharynx  clear      Nasal turbinates nl    NECK :  without  apparent JVD/ palpable Nodes/TM    LUNGS: no acc muscle use,  Nl contour chest with a few insp coarse crackles and rhonchi R > L base   CV:  RRR  no s3 or murmur or increase in P2, and no edema   ABD:  soft and nontender   MS:  Gait nl   ext warm without deformities Or obvious joint restrictions  calf tenderness, cyanosis or clubbing    SKIN: warm and dry without lesions    NEURO:  alert, approp, nl sensorium with  no motor or cerebellar deficits apparent.       CXR PA and Lateral:   05/10/2023 :    I personally reviewed images and impression is as follows:     No change from prior study = Stable bilateral lung opacities and calcified pleural plaques are noted as well as pleural thickening consistent with history of asbestos exposure.  Assessment

## 2023-05-10 ENCOUNTER — Encounter: Payer: Self-pay | Admitting: Internal Medicine

## 2023-05-10 ENCOUNTER — Ambulatory Visit: Payer: Medicare Other | Admitting: Internal Medicine

## 2023-05-10 ENCOUNTER — Ambulatory Visit (HOSPITAL_COMMUNITY)
Admission: RE | Admit: 2023-05-10 | Discharge: 2023-05-10 | Disposition: A | Discharge status: Home / Self Care | Payer: Medicare Other | Source: Ambulatory Visit | Attending: Internal Medicine | Admitting: Internal Medicine

## 2023-05-10 VITALS — BP 106/68 | HR 102 | Ht 71.0 in | Wt 155.0 lb

## 2023-05-10 DIAGNOSIS — J61 Pneumoconiosis due to asbestos and other mineral fibers: Secondary | ICD-10-CM | POA: Diagnosis not present

## 2023-05-10 DIAGNOSIS — J984 Other disorders of lung: Secondary | ICD-10-CM | POA: Diagnosis not present

## 2023-05-10 DIAGNOSIS — J929 Pleural plaque without asbestos: Secondary | ICD-10-CM | POA: Diagnosis not present

## 2023-05-10 DIAGNOSIS — J329 Chronic sinusitis, unspecified: Secondary | ICD-10-CM | POA: Diagnosis not present

## 2023-05-10 DIAGNOSIS — J949 Pleural condition, unspecified: Secondary | ICD-10-CM | POA: Diagnosis not present

## 2023-05-10 DIAGNOSIS — R059 Cough, unspecified: Secondary | ICD-10-CM | POA: Diagnosis not present

## 2023-05-10 MED ORDER — CEFDINIR 300 MG PO CAPS: 300.0000 mg | ORAL_CAPSULE | Freq: Two times a day (BID) | ORAL | 0 refills | Status: DC

## 2023-05-10 NOTE — Assessment & Plan Note (Signed)
 Long h/o exposure hx including working for [employer] in [place] in the ? 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 No definitive imaging findings to suggest asbestosis at this time. 2. Small pulmonary nodules scattered throughout the lungs bilaterally, stable compared to prior examinations, largest of which has a mean diameter of 8 mm in the left lower lobe. Non-contrast chest CT at 12 months is recommended> placed in reminder file for 02/04/21 CT 02/17/21  1. Asbestos related pleural disease. Thin rind of loculated pleural fluid bilaterally, similar on the right and new on the left. Associated basilar predominant parenchymal banding and volume loss, progressive from 04/27/2019. Findings may be due to asbestosis. If further evaluation is desired, high-resolution chest CT without contrast is recommended in future evaluation. 2. Pulmonary nodules are unchanged from 04/27/2019 and considered benign. 3. Cirrhosis. cxr report 07/29/21 R loculated effusion with atx RLL  - 07/29/2021   Walked on RA  x  3  lap(s) =  approx 450  ft  @ fast pace, stopped due to end of study s sob  with lowest 02 sats 91%  - 10/28/2021   Walked on RA  x  3  lap(s) =  approx 450  ft  @ moderate pace, stopped due to end of study  with lowest 02 sats 92% and no sob   - 08/03/2022   Walked on RA  x  3  lap(s) =  approx 450  ft  @ mod   pace, stopped due to end of study  with lowest 02 sats 93% and min sob  - 11/03/2022   Walked on RA  x  3  lap(s) =  approx 450  ft  @ brisk pace, stopped due to end of study  with lowest 02 sats 93% and no sob    Worse cough/ congestion assoc with discolored mucus and nasal congestion c/w rhinitis/ sinusitis/ bronchitis > rx omnicef and f/u with ent as planned   For cough/ congestion > mucinex or mucinex dm  up to maximum of  1200 mg every 12 hours and use the flutter  valve    F/u pulm clinic : 6 m sooner prn          Each maintenance medication was reviewed in detail including emphasizing most importantly the difference between maintenance and prns and under what circumstances the prns are to be triggered using an action plan format where appropriate.  Total time for H and P, chart review, counseling, reviewing flutter  device(s) and generating customized AVS unique to this office visit / same day charting = 32 min

## 2023-05-10 NOTE — Patient Instructions (Addendum)
 For cough/ congestion > mucinex or mucinex dm  up to maximum of  1200 mg every 12 hours and use the flutter valve as much as you can    Omnicef 300 mg twice daily x 10 days and follow up with your sinus doctor   Please remember to go to the  x-ray department  @  [facility] for your tests - we will call you with the results when they are available     Please schedule a follow up visit in 6 months but call sooner if needed

## 2023-05-17 ENCOUNTER — Ambulatory Visit: Payer: Medicare Other | Admitting: Internal Medicine

## 2023-05-27 ENCOUNTER — Other Ambulatory Visit: Payer: Self-pay | Admitting: Internal Medicine

## 2023-06-03 DIAGNOSIS — Z961 Presence of intraocular lens: Secondary | ICD-10-CM | POA: Diagnosis not present

## 2023-06-03 DIAGNOSIS — E119 Type 2 diabetes mellitus without complications: Secondary | ICD-10-CM | POA: Diagnosis not present

## 2023-06-16 ENCOUNTER — Ambulatory Visit: Payer: Medicare Other | Admitting: Podiatry

## 2023-06-16 ENCOUNTER — Encounter: Payer: Self-pay | Admitting: Podiatry

## 2023-06-16 DIAGNOSIS — M79674 Pain in right toe(s): Secondary | ICD-10-CM | POA: Diagnosis not present

## 2023-06-16 DIAGNOSIS — M79675 Pain in left toe(s): Secondary | ICD-10-CM | POA: Diagnosis not present

## 2023-06-16 DIAGNOSIS — B351 Tinea unguium: Secondary | ICD-10-CM | POA: Diagnosis not present

## 2023-06-16 DIAGNOSIS — E119 Type 2 diabetes mellitus without complications: Secondary | ICD-10-CM | POA: Diagnosis not present

## 2023-06-16 NOTE — Progress Notes (Signed)
 This patient returns to my office for at risk foot care.  This patient requires this care by a professional since this patient will be at risk due to having diabetes.  This patient is unable to cut nails himself since the patient cannot reach his nails.These nails are painful walking and wearing shoes.  He has yellow disfigured nail left big toe.  He was given penlac previously but he sees no improvement. This patient presents for at risk foot care today.  General Appearance  Alert, conversant and in no acute stress.  Vascular  Dorsalis pedis and posterior tibial  pulses are palpable  bilaterally.  Capillary return is within normal limits  bilaterally. Temperature is within normal limits  bilaterally.  Neurologic  Senn-Weinstein monofilament wire test within normal limits  bilaterally. Muscle power within normal limits bilaterally.  Nails Thick disfigured discolored nails with subungual debris  from hallux to fifth toes bilaterally. No evidence of bacterial infection or drainage bilaterally. His left hallux toenail left foot is yellow, disfigured and unattached to nail bed distally.  Orthopedic  No limitations of motion  feet .  No crepitus or effusions noted.  No bony pathology or digital deformities noted.  Skin  normotropic skin with no porokeratosis noted bilaterally.  No signs of infections or ulcers noted.     Onychomycosis  Pain in right toes  Pain in left toes  Nail Dystrophy left hallux toenail.  Consent was obtained for treatment procedures.   Mechanical debridement of nails 1-5  bilaterally performed with a nail nipper.  Filed with dremel without incident.    Return office visit    4 months                  Told patient to return for periodic foot care and evaluation due to potential at risk complications.     Helane Gunther DPM

## 2023-06-23 ENCOUNTER — Ambulatory Visit (INDEPENDENT_AMBULATORY_CARE_PROVIDER_SITE_OTHER): Payer: Medicare Other

## 2023-06-23 VITALS — Ht 71.0 in | Wt 145.0 lb

## 2023-06-23 DIAGNOSIS — E119 Type 2 diabetes mellitus without complications: Secondary | ICD-10-CM

## 2023-06-23 DIAGNOSIS — Z0000 Encounter for general adult medical examination without abnormal findings: Secondary | ICD-10-CM

## 2023-06-23 NOTE — Progress Notes (Signed)
 Because this visit was a virtual/telehealth visit,  certain criteria was not obtained, such a blood pressure, CBG if applicable, and timed get up and go. Any medications not marked as "taking" were not mentioned during the medication reconciliation part of the visit. Any vitals not documented were not able to be obtained due to this being a telehealth visit or patient was unable to self-report a recent blood pressure reading due to a lack of equipment at home via telehealth. Vitals that have been documented are verbally provided by the patient.   Subjective:   Dominic [name] is a 83 y.o. who presents for a Medicare Wellness preventive visit.  Visit Complete: Virtual I connected with  [facility] on 06/23/23 by a audio enabled telemedicine application and verified that I am speaking with the correct person using two identifiers.  Patient Location: Home  Provider Location: Home Office  I discussed the limitations of evaluation and management by telemedicine. The patient expressed understanding and agreed to proceed.  Vital Signs: Because this visit was a virtual/telehealth visit, some criteria may be missing or patient reported. Any vitals not documented were not able to be obtained and vitals that have been documented are patient reported.  VideoDeclined- This patient declined Librarian, academic. Therefore the visit was completed with audio only.  Persons Participating in Visit: Patient.  AWV Questionnaire: No: Patient Medicare AWV questionnaire was not completed prior to this visit.  Cardiac Risk Factors include: advanced age (>78men, >71 women);diabetes mellitus;male gender;Other (see comment), Risk factor comments: chronic respiratory disease     Objective:    Today's Vitals   06/23/23 1322  Weight: 145 lb (65.8 kg)  Height: 5\' 11"  (1.803 m)   Body mass index is 20.22 kg/m.     06/23/2023    1:31 PM 06/10/2022    2:02 PM 10/05/2019    4:40 PM  10/05/2019    8:02 AM  Advanced Directives  Does Patient Have a Medical Advance Directive? No No Yes Yes  Type of Chief of Staff of [place];Living will  Does patient want to make changes to medical advance directive?   No - Patient declined   Copy of Healthcare Power of Attorney in Chart?   No - copy requested   Would patient like information on creating a medical advance directive? No - Patient declined Yes (MAU/Ambulatory/Procedural Areas - Information given)      Current Medications (verified) Outpatient Encounter Medications as of 06/23/2023  Medication Sig   Ascorbic Acid (VITAMIN C) 100 MG tablet Take 1,000 mg by mouth daily.   atorvastatin (LIPITOR) 20 MG tablet Take 1 tablet (20 mg total) by mouth daily.   B Complex Vitamins (B COMPLEX 100 PO) Take 1 tablet by mouth daily.   budesonide (PULMICORT) 0.5 MG/2ML nebulizer solution Take 0.5 mg by nebulization 2 (two) times daily.   Cholecalciferol ([employer] VIT D 5000 HIGH-POTENCY PO) Take 1 tablet by mouth daily.   ciclopirox (PENLAC) 8 % solution Apply topically at bedtime. Apply over nail and surrounding skin. Apply daily over previous coat. After seven (7) days, may remove with alcohol and continue cycle.   coenzyme Q10-levOCARNitine (CO Q-10 PLUS) 100-20 MG CAPS Take 200 mg by mouth daily.   famotidine (PEPCID) 20 MG tablet TAKE 1 TABLET BY MOUTH DAILY  AFTER SUPPER   ipratropium (ATROVENT) 0.06 % nasal spray Place 2 sprays into both nostrils 4 (four) times daily as needed for rhinitis.   loperamide (  IMODIUM A-D) 2 MG tablet Take 2 mg by mouth 4 (four) times daily as needed for diarrhea or loose stools.   losartan (COZAAR) 25 MG tablet Take 1 tablet (25 mg total) by mouth daily.   Magnesium 400 MG CAPS Take by mouth.   pantoprazole (PROTONIX) 40 MG tablet TAKE 1 TABLET BY MOUTH DAILY 30  TO 60 MINUTES BEFORE FIRST MEAL  OF THE DAY   PARoxetine (PAXIL) 30 MG tablet TAKE 1 TABLET BY MOUTH  DAILY   triamcinolone (NASACORT) 55 MCG/ACT AERO nasal inhaler Place 2 sprays into the nose daily.   VITAMIN A PO Take 1 tablet by mouth daily.   vitamin B-12 (CYANOCOBALAMIN) 500 MCG tablet Take 500 mcg by mouth daily.   vitamin E 180 MG (400 UNITS) capsule Take 400 Units by mouth daily.   [DISCONTINUED] cefdinir (OMNICEF) 300 MG capsule Take 1 capsule (300 mg total) by mouth 2 (two) times daily. (Patient not taking: Reported on 06/23/2023)   No facility-administered encounter medications on file as of 06/23/2023.    Allergies (verified) Patient has no known allergies.   History: Past Medical History:  Diagnosis Date   Asbestos exposure    Diabetes mellitus without complication (HCC)    Emphysema lung (HCC)    Past Surgical History:  Procedure Laterality Date   APPENDECTOMY     Family History  Problem Relation Age of Onset   Lung cancer Mother    Lung cancer Father    Kidney failure Brother    Social History   Socioeconomic History   Marital status: Married    Spouse name: Not on file   Number of children: Not on file   Years of education: Not on file   Highest education level: Not on file  Occupational History   Not on file  Tobacco Use   Smoking status: Former    Current packs/day: 0.00    Types: Cigarettes    Quit date: 1980    Years since quitting: 45.3   Smokeless tobacco: Never  Vaping Use   Vaping status: Never Used  Substance and Sexual Activity   Alcohol use: Never   Drug use: Never   Sexual activity: Not on file  Other Topics Concern   Not on file  Social History Narrative   Not on file   Social Drivers of Health   Financial Resource Strain: Low Risk  (06/23/2023)   Overall Financial Resource Strain (CARDIA)    Difficulty of Paying Living Expenses: Not hard at all  Food Insecurity: No Food Insecurity (06/23/2023)   Hunger Vital Sign    Worried About Running Out of Food in the Last Year: Never true    Ran Out of Food in the Last Year: Never true   Transportation Needs: No Transportation Needs (06/23/2023)   PRAPARE - Administrator, Civil Service (Medical): No    Lack of Transportation (Non-Medical): No  Physical Activity: Patient Declined (06/23/2023)   Exercise Vital Sign    Days of Exercise per Week: Patient declined    Minutes of Exercise per Session: Patient declined  Stress: No Stress Concern Present (06/23/2023)   [employer] of Occupational Health - Occupational Stress Questionnaire    Feeling of Stress : Not at all  Social Connections: Moderately Integrated (06/23/2023)   Social Connection and Isolation Panel [NHANES]    Frequency of Communication with Friends and Family: More than three times a week    Frequency of Social Gatherings with Friends and Family:  More than three times a week    Attends Religious Services: More than 4 times per year    Active Member of Clubs or Organizations: No    Attends Banker Meetings: Never    Marital Status: Married    Tobacco Counseling Counseling given: Yes    Clinical Intake:  Pre-visit preparation completed: Yes  Pain : No/denies pain     BMI - recorded: 20.22 Nutritional Status: BMI of 19-24  Normal Nutritional Risks: None Diabetes: No  Lab Results  Component Value Date   HGBA1C 6.5 (H) 02/15/2023   HGBA1C 6.4 (H) 05/12/2022   HGBA1C 7.3 (H) 10/05/2019     How often do you need to have someone help you when you read instructions, pamphlets, or other written materials from your doctor or pharmacy?: 1 - Never  Interpreter Needed?: No  Information entered by :: Maryjean Ka CMA   Activities of Daily Living     06/23/2023    1:27 PM  In your present state of health, do you have any difficulty performing the following activities:  Hearing? 1  Comment wears hearing aids  Vision? 0  Difficulty concentrating or making decisions? 0  Walking or climbing stairs? 1  Comment pt states he gets dizzy and he also has neuropathy  Dressing or  bathing? 0  Doing errands, shopping? 0  Preparing Food and eating ? Y  Comment grandaughter prepares his meals  Using the Toilet? N  In the past six months, have you accidently leaked urine? N  Do you have problems with loss of bowel control? N  Managing your Medications? N  Managing your Finances? N  Housekeeping or managing your Housekeeping? Y  Comment granddaughter takes care of patients housekeeping    Patient Care Team: Billie Lade, MD as PCP - General (Internal Medicine) Nyoka Cowden, MD as Consulting Physician (Pulmonary Disease)  Indicate any recent Medical Services you may have received from other than [facility] providers in the past year (date may be approximate).     Assessment:   This is a routine wellness examination for [place].  Hearing/Vision screen Hearing Screening - Comments:: Patient wears hearing aids  Vision Screening - Comments:: Patient wears reading glasses only. Up to date with yearly exams.  Patient sees an eye doctor in [place]     Goals Addressed             This Visit's Progress    Patient Stated       Remain active and healthy       Depression Screen     06/23/2023    1:35 PM 02/15/2023    9:05 AM 08/13/2022    8:33 AM 06/10/2022    2:04 PM 05/12/2022    9:07 AM  PHQ 2/9 Scores  PHQ - 2 Score 0 0 0 0 0  PHQ- 9 Score 6 6   9     Fall Risk     06/23/2023    1:32 PM 02/15/2023    9:04 AM 08/13/2022    8:32 AM 06/10/2022    2:04 PM 05/12/2022    9:07 AM  Fall Risk   Falls in the past year? 0 0 0 0 0  Number falls in past yr: 0 0 0  0  Injury with Fall? 0 0 0  0  Risk for fall due to : Impaired balance/gait;Orthopedic patient;Impaired mobility;Other (Comment) No Fall Risks No Fall Risks  No Fall Risks  Risk for fall due to:  Comment patient states he gets dizzy but he sits down before he falls      Follow up Falls prevention discussed;Education provided Falls evaluation completed Falls evaluation completed  Falls evaluation  completed    MEDICARE RISK AT HOME:  Medicare Risk at Home Any stairs in or around the home?: Yes If so, are there any without handrails?: No Home free of loose throw rugs in walkways, pet beds, electrical cords, etc?: Yes Adequate lighting in your home to reduce risk of falls?: Yes Life alert?: No Use of a cane, walker or w/c?: No Grab bars in the bathroom?: Yes Shower chair or bench in shower?: No Elevated toilet seat or a handicapped toilet?: No  TIMED UP AND GO:  Was the test performed?  No  Cognitive Function: 6CIT completed        06/23/2023    1:33 PM 06/10/2022    2:04 PM  6CIT Screen  What Year? 0 points 0 points  What month? 0 points 0 points  What time? 0 points 0 points  Count back from 20 0 points 0 points  Months in reverse 0 points 0 points  Repeat phrase 4 points 0 points  Total Score 4 points 0 points    Immunizations Immunization History  Administered Date(s) Administered   Fluad Trivalent(High Dose 65+) 02/15/2023   Influenza,inj,Quad PF,6+ Mos 12/14/2020   Moderna SARS-COV2 Booster Vaccination 01/18/2020   Moderna Sars-Covid-2 Vaccination 04/19/2019, 05/17/2019   Pfizer Covid-19 Vaccine Bivalent Booster 3yrs & up 01/16/2021    Screening Tests Health Maintenance  Topic Date Due   Pneumonia Vaccine 9+ Years old (1 of 2 - PCV) Never done   OPHTHALMOLOGY EXAM  Never done   DTaP/Tdap/Td (1 - Tdap) Never done   Zoster Vaccines- Shingrix (1 of 2) Never done   COVID-19 Vaccine (5 - 2024-25 season) 11/15/2022   Diabetic kidney evaluation - Urine ACR  05/13/2023   HEMOGLOBIN A1C  08/16/2023   FOOT EXAM  08/26/2023   INFLUENZA VACCINE  10/15/2023   Diabetic kidney evaluation - eGFR measurement  02/15/2024   Medicare Annual Wellness (AWV)  06/22/2024   HPV VACCINES  Aged Out    Health Maintenance  Health Maintenance Due  Topic Date Due   Pneumonia Vaccine 44+ Years old (1 of 2 - PCV) Never done   OPHTHALMOLOGY EXAM  Never done   DTaP/Tdap/Td  (1 - Tdap) Never done   Zoster Vaccines- Shingrix (1 of 2) Never done   COVID-19 Vaccine (5 - 2024-25 season) 11/15/2022   Diabetic kidney evaluation - Urine ACR  05/13/2023   Health Maintenance Items Addressed: UACR (Urine Albumin:Creatinine Ratio) Patient advised of recommended vaccines and where to obtain those vaccines with verbal understanding   Additional Screening:  Vision Screening: Recommended annual ophthalmology exams for early detection of glaucoma and other disorders of the eye. Patient is up to date with eye exam  Dental Screening: Recommended annual dental exams for proper oral hygiene  Community Resource Referral / Chronic Care Management: CRR required this visit?  No   CCM required this visit?  No     Plan:     I have personally reviewed and noted the following in the patient's chart:   Medical and social history Use of alcohol, tobacco or illicit drugs  Current medications and supplements including opioid prescriptions. Patient is not currently taking opioid prescriptions. Functional ability and status Nutritional status Physical activity Advanced directives List of other physicians Hospitalizations, surgeries, and ER visits in previous  12 months Vitals Screenings to include cognitive, depression, and falls Referrals and appointments  In addition, I have reviewed and discussed with patient certain preventive protocols, quality metrics, and best practice recommendations. A written personalized care plan for preventive services as well as general preventive health recommendations were provided to patient.     Jordan Hawks Jerimie Mancuso, CMA   06/23/2023   After Visit Summary: (Mail) Due to this being a telephonic visit, the after visit summary with patients personalized plan was offered to patient via mail   Notes: Please refer to Routing Comments.

## 2023-06-23 NOTE — Patient Instructions (Signed)
 Mr. Lienhard , Thank you for taking time to come for your Medicare Wellness Visit. I appreciate your ongoing commitment to your health goals. Please review the following plan we discussed and let me know if I can assist you in the future.   Please see your treatment plan below: Referrals:No referrals were placed today Preventative Screenings:You are up to date on all of your preventative screenings Labs:Please have the following labs drawn at [employer] at [facility]. You do not have to schedule an appointment for this.  Diabetic Urine Kidney Screening Follow-Up:Next Medicare AWV: June 27, 2024 at 9:20 am TELEPHONE VISIT Clinician Recommendations: Aim for 30 minutes of exercise or brisk walking, 6-8 glasses of water, and 5 servings of fruits and vegetables each day. You are due for the vaccines checked below. You may have these done at your preferred pharmacy. Please have them fax the office proof of the vaccines so that we can update your chart.   []  Flu (due annually)  Recommended this fall either at PCP office or through your local pharmacy. The flu season starts August 1 of each year.   [x]  Shingrix (Shingles vaccine): CDC recommends 2 doses of Shingrix separated by 2-6 months for aged 31 years and older:  [x]  Pneumonia Vaccines: Recommended for adults 65 years or older  [x]  TDAP (Tetanus) Vaccine every 10 years:Recommended every 10 years; Please call your insurance company to determine your out of pocket expense. You also receive this vaccine at your local pharmacy or Health Dept.  [x]  Covid-19: Available now at any [facility] pharmacy (see info below)  You may also get your vaccines at any [facility] (locations listed below.) Vaccine hours are Monday - Friday 9:00 - 4:00. No appointments are required. Most insurances are accepted including Medicaid. Anyone can use the community pharmacies, and people are not required to have a [facility] provider.  Community  Pharmacy Locations offering vaccines:   [facility] and exercise psychologist   [facility]  10 vaccines are offered at the [employer]: Covid, flu, Tdap, shingles, RSV, pneumonia, meningococcal, hepatitis A, hepatitis B, and HPV.    This is a list of the screening recommended for you and due dates:  Health Maintenance  Topic Date Due   Pneumonia Vaccine (1 of 2 - PCV) Never done   DTaP/Tdap/Td vaccine (1 - Tdap) Never done   Zoster (Shingles) Vaccine (1 of 2) Never done   COVID-19 Vaccine (5 - 2024-25 season) 11/15/2022   Yearly kidney health urinalysis for diabetes  05/13/2023   Hemoglobin A1C  08/16/2023   Complete foot exam   08/26/2023   Flu Shot  10/15/2023   Yearly kidney function blood test for diabetes  02/15/2024   Eye exam for diabetics  06/02/2024   Medicare Annual Wellness Visit  06/22/2024   HPV Vaccine  Aged Out    Advanced directives: (Declined) Advance directive discussed with you today. Even though you declined this today, please call our office should you change your mind, and we can give you the proper paperwork for you to fill out.   Advance Care Planning is important because it:  [x]  Makes sure you receive the medical care that is consistent with your values, goals, and preferences  [x]  It provides guidance to your family and loved ones and it also reduces their decisional burden about whether or not they are making the right  decisions based on what you want done  Follow the link provided in your after visit summary or read over the paperwork we have mailed to you to help you started getting your Advance Directives in place. If you need assistance in completing these, please reach out to [place] so that we can help you!   Next Medicare Annual Wellness Visit scheduled for next year: yes  Understanding Your Risk for Falls Millions of people have serious injuries from falls  each year. It is important to understand your risk of falling. Talk with your health care provider about your risk and what you can do to lower it. If you do have a serious fall, make sure to tell your provider. Falling once raises your risk of falling again. How can falls affect me? Serious injuries from falls are common. These include: Broken bones, such as hip fractures. Head injuries, such as traumatic brain injuries (TBI) or concussions. A fear of falling can cause you to avoid activities and stay at home. This can make your muscles weaker and raise your risk for a fall. What can increase my risk? There are a number of risk factors that increase your risk for falling. The more risk factors you have, the higher your risk of falling. Serious injuries from a fall happen most often to people who are older than 84 years old. Teenagers and young adults ages 38-29 are also at higher risk. Common risk factors include: Weakness in the lower body. Being generally weak or confused due to long-term (chronic) illness. Dizziness or balance problems. Poor vision. Medicines that cause dizziness or drowsiness. These may include: Medicines for your blood pressure, heart, anxiety, insomnia, or swelling (edema). Pain medicines. Muscle relaxants. Other risk factors include: Drinking alcohol. Having had a fall in the past. Having foot pain or wearing improper footwear. Working at a dangerous job. Having any of the following in your home: Tripping hazards, such as floor clutter or loose rugs. Poor lighting. Pets. Having dementia or memory loss. What actions can I take to lower my risk of falling?     Physical activity Stay physically fit. Do strength and balance exercises. Consider taking a regular class to build strength and balance. Yoga and tai chi are good options. Vision Have your eyes checked every year and your prescription for glasses or contacts updated as needed. Shoes and walking  aids Wear non-skid shoes. Wear shoes that have rubber soles and low heels. Do not wear high heels. Do not walk around the house in socks or slippers. Use a cane or walker as told by your provider. Home safety Attach secure railings on both sides of your stairs. Install grab bars for your bathtub, shower, and toilet. Use a non-skid mat in your bathtub or shower. Attach bath mats securely with double-sided, non-slip rug tape. Use good lighting in all rooms. Keep a flashlight near your bed. Make sure there is a clear path from your bed to the bathroom. Use night-lights. Do not use throw rugs. Make sure all carpeting is taped or tacked down securely. Remove all clutter from walkways and stairways, including extension cords. Repair uneven or broken steps and floors. Avoid walking on icy or slippery surfaces. Walk on the grass instead of on icy or slick sidewalks. Use ice melter to get rid of ice on walkways in the winter. Use a cordless phone. Questions to ask your health care provider Can you help me check my risk for a fall? Do any of my medicines make  me more likely to fall? Should I take a vitamin D supplement? What exercises can I do to improve my strength and balance? Should I make an appointment to have my vision checked? Do I need a bone density test to check for weak bones (osteoporosis)? Would it help to use a cane or a walker? Where to find more information Centers for Disease Control and Prevention, STEADI: TonerPromos.no Community-Based Fall Prevention Programs: TonerPromos.no [employer] on Aging: BaseRingTones.pl Contact a health care provider if: You fall at home. You are afraid of falling at home. You feel weak, drowsy, or dizzy. This information is not intended to replace advice given to you by your health care provider. Make sure you discuss any questions you have with your health care provider. Document Revised: 11/03/2021 Document Reviewed: 11/03/2021 Elsevier Patient Education   2024 [employer].

## 2023-07-09 ENCOUNTER — Ambulatory Visit: Admitting: Internal Medicine

## 2023-07-28 ENCOUNTER — Emergency Department (HOSPITAL_COMMUNITY)

## 2023-07-28 ENCOUNTER — Observation Stay (HOSPITAL_COMMUNITY)
Admission: EM | Admit: 2023-07-28 | Discharge: 2023-07-31 | Disposition: A | Discharge status: Home / Self Care | Attending: Internal Medicine | Admitting: Internal Medicine

## 2023-07-28 ENCOUNTER — Other Ambulatory Visit: Payer: Self-pay

## 2023-07-28 ENCOUNTER — Encounter (HOSPITAL_COMMUNITY): Payer: Self-pay

## 2023-07-28 DIAGNOSIS — I951 Orthostatic hypotension: Secondary | ICD-10-CM | POA: Insufficient documentation

## 2023-07-28 DIAGNOSIS — J441 Chronic obstructive pulmonary disease with (acute) exacerbation: Principal | ICD-10-CM | POA: Diagnosis present

## 2023-07-28 DIAGNOSIS — Z77090 Contact with and (suspected) exposure to asbestos: Secondary | ICD-10-CM | POA: Diagnosis not present

## 2023-07-28 DIAGNOSIS — J949 Pleural condition, unspecified: Secondary | ICD-10-CM | POA: Diagnosis not present

## 2023-07-28 DIAGNOSIS — E1165 Type 2 diabetes mellitus with hyperglycemia: Secondary | ICD-10-CM | POA: Diagnosis not present

## 2023-07-28 DIAGNOSIS — F411 Generalized anxiety disorder: Secondary | ICD-10-CM | POA: Insufficient documentation

## 2023-07-28 DIAGNOSIS — Z7951 Long term (current) use of inhaled steroids: Secondary | ICD-10-CM | POA: Insufficient documentation

## 2023-07-28 DIAGNOSIS — R0902 Hypoxemia: Secondary | ICD-10-CM | POA: Diagnosis not present

## 2023-07-28 DIAGNOSIS — I110 Hypertensive heart disease with heart failure: Secondary | ICD-10-CM | POA: Diagnosis not present

## 2023-07-28 DIAGNOSIS — J9601 Acute respiratory failure with hypoxia: Principal | ICD-10-CM | POA: Diagnosis present

## 2023-07-28 DIAGNOSIS — E119 Type 2 diabetes mellitus without complications: Secondary | ICD-10-CM

## 2023-07-28 DIAGNOSIS — Z87891 Personal history of nicotine dependence: Secondary | ICD-10-CM | POA: Diagnosis not present

## 2023-07-28 DIAGNOSIS — J90 Pleural effusion, not elsewhere classified: Secondary | ICD-10-CM | POA: Diagnosis not present

## 2023-07-28 DIAGNOSIS — R059 Cough, unspecified: Secondary | ICD-10-CM | POA: Diagnosis not present

## 2023-07-28 DIAGNOSIS — Z8709 Personal history of other diseases of the respiratory system: Secondary | ICD-10-CM | POA: Diagnosis not present

## 2023-07-28 DIAGNOSIS — R0609 Other forms of dyspnea: Secondary | ICD-10-CM

## 2023-07-28 DIAGNOSIS — F32A Depression, unspecified: Secondary | ICD-10-CM | POA: Insufficient documentation

## 2023-07-28 DIAGNOSIS — J929 Pleural plaque without asbestos: Secondary | ICD-10-CM | POA: Diagnosis not present

## 2023-07-28 DIAGNOSIS — R7989 Other specified abnormal findings of blood chemistry: Secondary | ICD-10-CM | POA: Insufficient documentation

## 2023-07-28 DIAGNOSIS — J984 Other disorders of lung: Secondary | ICD-10-CM | POA: Diagnosis not present

## 2023-07-28 DIAGNOSIS — J9602 Acute respiratory failure with hypercapnia: Secondary | ICD-10-CM | POA: Insufficient documentation

## 2023-07-28 DIAGNOSIS — R0602 Shortness of breath: Secondary | ICD-10-CM | POA: Diagnosis not present

## 2023-07-28 DIAGNOSIS — I10 Essential (primary) hypertension: Secondary | ICD-10-CM | POA: Diagnosis present

## 2023-07-28 DIAGNOSIS — E782 Mixed hyperlipidemia: Secondary | ICD-10-CM | POA: Diagnosis not present

## 2023-07-28 DIAGNOSIS — R000 Tachycardia, unspecified: Secondary | ICD-10-CM | POA: Diagnosis not present

## 2023-07-28 DIAGNOSIS — Z79899 Other long term (current) drug therapy: Secondary | ICD-10-CM | POA: Insufficient documentation

## 2023-07-28 DIAGNOSIS — I5021 Acute systolic (congestive) heart failure: Secondary | ICD-10-CM | POA: Diagnosis not present

## 2023-07-28 DIAGNOSIS — K219 Gastro-esophageal reflux disease without esophagitis: Secondary | ICD-10-CM | POA: Diagnosis present

## 2023-07-28 DIAGNOSIS — J449 Chronic obstructive pulmonary disease, unspecified: Secondary | ICD-10-CM | POA: Diagnosis present

## 2023-07-28 LAB — CBC WITH DIFFERENTIAL/PLATELET
Abs Immature Granulocytes: 0.04 10*3/uL (ref 0.00–0.07)
Basophils Absolute: 0.1 10*3/uL (ref 0.0–0.1)
Basophils Relative: 1 %
Eosinophils Absolute: 0.3 10*3/uL (ref 0.0–0.5)
Eosinophils Relative: 4 %
HCT: 41.7 % (ref 39.0–52.0)
Hemoglobin: 13.2 g/dL (ref 13.0–17.0)
Immature Granulocytes: 1 %
Lymphocytes Relative: 8 %
Lymphs Abs: 0.7 10*3/uL (ref 0.7–4.0)
MCH: 27.9 pg (ref 26.0–34.0)
MCHC: 31.7 g/dL (ref 30.0–36.0)
MCV: 88.2 fL (ref 80.0–100.0)
Monocytes Absolute: 0.9 10*3/uL (ref 0.1–1.0)
Monocytes Relative: 10 %
Neutro Abs: 6.4 10*3/uL (ref 1.7–7.7)
Neutrophils Relative %: 76 %
Platelets: 301 10*3/uL (ref 150–400)
RBC: 4.73 MIL/uL (ref 4.22–5.81)
RDW: 14.9 % (ref 11.5–15.5)
WBC: 8.4 10*3/uL (ref 4.0–10.5)
nRBC: 0 % (ref 0.0–0.2)

## 2023-07-28 LAB — I-STAT CHEM 8, ED
BUN: 18 mg/dL (ref 8–23)
Calcium, Ion: 1.16 mmol/L (ref 1.15–1.40)
Chloride: 99 mmol/L (ref 98–111)
Creatinine, Ser: 0.7 mg/dL (ref 0.61–1.24)
Glucose, Bld: 128 mg/dL — ABNORMAL HIGH (ref 70–99)
HCT: 40 % (ref 39.0–52.0)
Hemoglobin: 13.6 g/dL (ref 13.0–17.0)
Potassium: 4.6 mmol/L (ref 3.5–5.1)
Sodium: 137 mmol/L (ref 135–145)
TCO2: 27 mmol/L (ref 22–32)

## 2023-07-28 LAB — D-DIMER, QUANTITATIVE: D-Dimer, Quant: 0.96 ug{FEU}/mL — ABNORMAL HIGH (ref 0.00–0.50)

## 2023-07-28 LAB — BLOOD GAS, VENOUS
Acid-Base Excess: 6.5 mmol/L — ABNORMAL HIGH (ref 0.0–2.0)
Bicarbonate: 32.8 mmol/L — ABNORMAL HIGH (ref 20.0–28.0)
Drawn by: 68414
O2 Saturation: 14 %
Patient temperature: 37.7
pCO2, Ven: 55 mmHg (ref 44–60)
pH, Ven: 7.39 (ref 7.25–7.43)
pO2, Ven: <31 mmHg — CL (ref 32–45)

## 2023-07-28 LAB — BRAIN NATRIURETIC PEPTIDE: B Natriuretic Peptide: 108 pg/mL — ABNORMAL HIGH (ref 0.0–100.0)

## 2023-07-28 LAB — RESP PANEL BY RT-PCR (RSV, FLU A&B, COVID)  RVPGX2
Influenza A by PCR: NEGATIVE
Influenza B by PCR: NEGATIVE
Resp Syncytial Virus by PCR: NEGATIVE
SARS Coronavirus 2 by RT PCR: NEGATIVE

## 2023-07-28 MED ORDER — METHYLPREDNISOLONE SODIUM SUCC 125 MG IJ SOLR
125.0000 mg | Freq: Once | INTRAMUSCULAR | Status: AC
  Administered 2023-07-28: 125 mg via INTRAVENOUS
  Filled 2023-07-28: qty 2

## 2023-07-28 MED ORDER — FENTANYL CITRATE PF 50 MCG/ML IJ SOSY
50.0000 ug | PREFILLED_SYRINGE | Freq: Once | INTRAMUSCULAR | Status: AC
  Administered 2023-07-28: 50 ug via INTRAVENOUS
  Filled 2023-07-28: qty 1

## 2023-07-28 MED ORDER — ALBUTEROL SULFATE (2.5 MG/3ML) 0.083% IN NEBU
10.0000 mg/h | INHALATION_SOLUTION | Freq: Once | RESPIRATORY_TRACT | Status: AC
  Administered 2023-07-28: 10 mg/h via RESPIRATORY_TRACT
  Filled 2023-07-28: qty 12

## 2023-07-28 MED ORDER — IPRATROPIUM BROMIDE 0.02 % IN SOLN
0.5000 mg | Freq: Once | RESPIRATORY_TRACT | Status: AC
  Administered 2023-07-28: 0.5 mg via RESPIRATORY_TRACT
  Filled 2023-07-28: qty 2.5

## 2023-07-28 MED ORDER — IOHEXOL 350 MG/ML SOLN
75.0000 mL | Freq: Once | INTRAVENOUS | Status: AC | PRN
Start: 2023-07-28 — End: 2023-07-28
  Administered 2023-07-28: 75 mL via INTRAVENOUS

## 2023-07-28 NOTE — ED Triage Notes (Addendum)
 Pt reports worsening shortness of breath since yesterday with low O2@ at home and his chest hurts when he takes a deep breath.  Pt reports the gets "swimmy headed" when he stands up and walks around for several months now.

## 2023-07-28 NOTE — ED Provider Notes (Signed)
 [facility] AT [facility] Provider Note   CSN: [id number] Arrival date & time: 07/28/23  1603     History  Chief Complaint  Patient presents with   Shortness of Breath    Dominic [name] is a 83 y.o. male.  HPI     83 year old male comes in with chief complaint of shortness of breath.  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  Patient indicates that he has been having some chest pain and shortness of breath for the last few days.  Over the last 2 days, he is noticing chest pain with inspiration.  Chest pain is central.  Patient has exertional shortness of breath, and now gets short of breath walking in the house.  He thinks that about 3 to 4 months ago he was able to walk for several minutes.  Patient denies any orthopnea, PND like symptoms.  He has no history of blood clots in his legs or lungs denies any new leg pain or swelling.  Review of system is negative for wheezing.  Patient has cough, but it is chronic in nature.  He however does indicate that he has had pneumonia in the past with similar symptoms.  Home Medications Prior to Admission medications   Medication Sig Start Date End Date Taking? Authorizing Provider  Ascorbic Acid (VITAMIN C) 100 MG tablet Take 1,000 mg by mouth daily.    [provider]  atorvastatin  (LIPITOR) 20 MG tablet Take 1 tablet (20 mg total) by mouth daily. 02/15/23   Dixon, Phillip E, MD  B Complex Vitamins (B COMPLEX 100 PO) Take 1 tablet by mouth daily.    [provider]  budesonide (PULMICORT) 0.5 MG/2ML nebulizer solution Take 0.5 mg by nebulization 2 (two) times daily.    [provider]  Cholecalciferol ([employer] VIT D 5000 HIGH-POTENCY PO) Take 1 tablet by mouth daily.    [provider]  ciclopirox  (PENLAC ) 8 % solution Apply topically at bedtime. Apply over nail and surrounding skin. Apply daily over previous coat. After  seven (7) days, may remove with alcohol and continue cycle. 08/26/22   Sikora, Rebecca, DPM  coenzyme Q10-levOCARNitine (CO Q-10 PLUS) 100-20 MG CAPS Take 200 mg by mouth daily.    [provider]  famotidine  (PEPCID ) 20 MG tablet TAKE 1 TABLET BY MOUTH DAILY  AFTER SUPPER 05/28/23   Wert, Michael B, MD  ipratropium (ATROVENT) 0.06 % nasal spray Place 2 sprays into both nostrils 4 (four) times daily as needed for rhinitis.    [provider]  loperamide (IMODIUM A-D) 2 MG tablet Take 2 mg by mouth 4 (four) times daily as needed for diarrhea or loose stools.    [provider]  losartan  (COZAAR ) 25 MG tablet Take 1 tablet (25 mg total) by mouth daily. 11/10/19   Diamond Formica, MD  Magnesium 400 MG CAPS Take by mouth.    [provider]  pantoprazole  (PROTONIX ) 40 MG tablet TAKE 1 TABLET BY MOUTH DAILY 30  TO 60 MINUTES BEFORE FIRST MEAL  OF THE DAY 02/15/23   Tobi Fortes, MD  PARoxetine  (PAXIL ) 30 MG tablet TAKE 1 TABLET BY MOUTH DAILY 03/02/23   Dixon, Phillip E, MD  triamcinolone  (NASACORT ) 55 MCG/ACT AERO nasal inhaler Place 2 sprays into the nose daily. 02/16/20   Wert, Michael B, MD  VITAMIN A PO Take 1 tablet by mouth daily.    [provider]  vitamin B-12 (CYANOCOBALAMIN ) 500 MCG tablet Take 500 mcg by mouth daily.    [provider]  vitamin E 180 MG (400 UNITS) capsule Take 400 Units by mouth daily.    [provider]      Allergies    Patient has no known allergies.    Review of Systems   Review of Systems  All other systems reviewed and are negative.   Physical Exam Updated Vital Signs BP 105/66   Pulse 96   Temp 99.8 F (37.7 C) (Oral)   Resp (!) 22   Ht 5\' 11"  (1.803 m)   Wt 64.9 kg   SpO2 90%   BMI 19.94 kg/m  Physical Exam Vitals and nursing note reviewed.  Constitutional:      Appearance: He is well-developed.  HENT:     Head: Atraumatic.  Cardiovascular:     Rate and Rhythm: Normal rate.   Pulmonary:     Effort: Pulmonary effort is normal.     Breath sounds: Decreased breath sounds present. No wheezing, rhonchi or rales.  Musculoskeletal:     Cervical back: Neck supple.     Right lower leg: No tenderness. No edema.     Left lower leg: No tenderness. No edema.  Skin:    General: Skin is warm.  Neurological:     Mental Status: He is alert and oriented to person, place, and time.     ED Results / Procedures / Treatments   Labs (all labs ordered are listed, but only abnormal results are displayed) Labs Reviewed  BLOOD GAS, VENOUS - Abnormal; Notable for the following components:      Result Value   pO2, Ven <31 (*)    Bicarbonate 32.8 (*)    Acid-Base Excess 6.5 (*)    All other components within normal limits  BRAIN NATRIURETIC PEPTIDE - Abnormal; Notable for the following components:   B Natriuretic Peptide 108.0 (*)    All other components within normal limits  D-DIMER, QUANTITATIVE - Abnormal; Notable for the following components:   D-Dimer, Quant 0.96 (*)    All other components within normal limits  I-STAT CHEM 8, ED - Abnormal; Notable for the following components:   Glucose, Bld 128 (*)    All other components within normal limits  RESP PANEL BY RT-PCR (RSV, FLU A&B, COVID)  [id number]  CBC WITH DIFFERENTIAL/PLATELET    EKG EKG Interpretation Date/Time:  Wednesday Jul 28 2023 16:22:26 EDT Ventricular Rate:  87 PR Interval:  148 QRS Duration:  89 QT Interval:  349 QTC Calculation: 420 R Axis:   -30  Text Interpretation: Sinus rhythm Multiple ventricular premature complexes Abnormal R-wave progression, early transition Left ventricular hypertrophy ST elevation, consider inferior injury No acute changes No significant change since last tracing Confirmed by Deatra Face [phone]) on 07/28/2023 4:38:41 PM  Radiology CT Angio Chest PE W and/or Wo Contrast Result Date: 07/28/2023 CLINICAL DATA:  Worsening shortness of breath since yesterday, pain with  inspiration, hypoxia EXAM: CT ANGIOGRAPHY CHEST WITH CONTRAST TECHNIQUE: Multidetector CT imaging of the chest was performed using the standard protocol during bolus administration of intravenous contrast. Multiplanar CT image reconstructions and MIPs were obtained to evaluate the vascular anatomy. RADIATION DOSE REDUCTION: This exam was performed according to the departmental dose-optimization program which includes automated exposure control, adjustment of the mA and/or kV according to patient size and/or use of iterative reconstruction technique. CONTRAST:  75mL OMNIPAQUE IOHEXOL 350 MG/ML SOLN COMPARISON:  07/28/2023, 02/17/2021 FINDINGS: Cardiovascular: This  is a technically adequate evaluation of the pulmonary vasculature. No filling defects or pulmonary emboli. Mild cardiomegaly without pericardial effusion. No evidence of thoracic aortic aneurysm or dissection. Atherosclerosis of the aorta and coronary vasculature. Mediastinum/Nodes: No enlarged mediastinal, hilar, or axillary lymph nodes. Thyroid  gland, trachea, and esophagus demonstrate no significant findings. Lungs/Pleura: Stable upper lobe predominant emphysema. Stable bilateral calcified pleural plaques and circumferential pleural thickening greatest at the lung bases, consistent with prior asbestos exposure. Areas of subpleural scarring are again noted at the lung bases. No acute airspace disease. Trace bilateral pleural effusions. No pneumothorax. Central airways are patent. Upper Abdomen: No acute abnormality. Musculoskeletal: No acute or destructive bony abnormalities. Reconstructed images demonstrate no additional findings. Review of the MIP images confirms the above findings. IMPRESSION: 1. No evidence of pulmonary embolus. 2. Stable asbestos related pleural disease. 3. Trace bilateral pleural effusions. 4. Aortic Atherosclerosis (ICD10-I70.0) and Emphysema (ICD10-J43.9). Electronically Signed   By: Bobbye Burrow M.D.   On: 07/28/2023 21:40    DG Chest Port 1 View Result Date: 07/28/2023 CLINICAL DATA:  Shortness of breath EXAM: PORTABLE CHEST 1 VIEW COMPARISON:  Chest x-ray performed May 10, 2023 FINDINGS: Similar appearance of right-sided pleural effusion and pleuroparenchymal thickening. Trace left effusion/pleuroparenchymal thickening. Underlying changes of chronic lung disease with interstitial changes in calcifications noted. Heart mediastinum are not significantly changed. Calcified pleural plaques. No pneumothorax. IMPRESSION: 1. Changes of chronic lung disease which are detailed above. No definite plain film evidence of acute, superimposed process. Electronically Signed   By: Reagan Camera M.D.   On: 07/28/2023 17:04    Procedures Procedures    Medications Ordered in ED Medications  fentaNYL (SUBLIMAZE) injection 50 mcg (50 mcg Intravenous Given 07/28/23 1947)  iohexol (OMNIPAQUE) 350 MG/ML injection 75 mL (75 mLs Intravenous Contrast Given 07/28/23 2114)  albuterol  (PROVENTIL ) (2.5 MG/3ML) 0.083% nebulizer solution (10 mg/hr Nebulization Given 07/28/23 2306)  methylPREDNISolone  sodium succinate (SOLU-MEDROL ) 125 mg/2 mL injection 125 mg (125 mg Intravenous Given 07/28/23 2244)  ipratropium (ATROVENT) nebulizer solution 0.5 mg (0.5 mg Nebulization Given 07/28/23 2306)    ED Course/ Medical Decision Making/ A&P                                 Medical Decision Making Amount and/or Complexity of Data Reviewed Labs: ordered. Radiology: ordered.  Risk Prescription drug management. Decision regarding hospitalization.   This patient presents to the ED with chief complaint(s) of shortness of breath with pertinent past medical history of chronic lung disease including COPD/asbestosis related pathology, diabetes and no cardiac disease history.The complaint involves an extensive differential diagnosis and also carries with it a high risk of complications and morbidity.    The differential diagnosis includes : Pneumonia,  pulmonary hypertension, CHF, pulmonary embolism, COPD exacerbation, pleural effusion and anemia.  The initial plan is to get basic labs.  Patient is not wheezing right now.  Will not give him any breathing treatment.  We will get D-dimer, BNP as well.  No indication for troponin, low suspicion for ACS.  EKG will be ordered.   Additional history obtained: Records reviewed Primary Care Documents and pulmonary notes.  Independent labs interpretation:  The following labs were independently interpreted: Patient's BNP is reassuring.  CBC shows no profound anemia.  Independent visualization and interpretation of imaging: - I independently visualized the following imaging with scope of interpretation limited to determining acute life threatening conditions related to emergency care: CT of  the chest, which revealed no evidence of focal consolidation.  No PE per radiologist.  Treatment and Reassessment: Patient reassessed.  He has received breathing treatments.  He continues to feel short of breath upon ambulation.  I turned off the oxygen in the room.  Patient's O2 sats dropped to 89%. Patient informs me that he did not qualify for home oxygen, and thereafter his exercise tolerance has gone down.  I have reviewed patient's records including pulmonary notes.  It appears to me that patient has likely worsening of his chronic lung disease.  It would be best to admit him, focus on pulmonary rehab and see if he can improve his functional status.  He might even need oxygen to go home.  Stable for admission at this time.    Final Clinical Impression(s) / ED Diagnoses Final diagnoses:  Acute hypoxic respiratory failure (HCC)  DOE (dyspnea on exertion)    Rx / DC Orders ED Discharge Orders     None         Deatra Face, MD 07/28/23 2330

## 2023-07-28 NOTE — H&P (Signed)
 History and Physical    PatientQuinten [name] VWU:[id number] DOB: Jan 17, 1941 DOA: 07/28/2023 DOS: the patient was seen and examined on 07/29/2023 PCP: Tobi Fortes, MD  Patient coming from: Home  Chief Complaint:  Chief Complaint  Patient presents with   Shortness of Breath   HPI: Dominic [name] is an 83 y.o. male with medical history significant of interstitial lung disease including asbestosis, T2DM, COPD who presents to the emergency department due to several days of reproducible pleuritic chest pain and shortness of breath which worsens with deep breath.  Within the last 2 days, he complained of worsening shortness of breath when walking in the house.  This is different from 3 to 4 months ago where he can walk for several minutes without being short of breath.  He denies any history of DVT or orthopnea.  ED Course:  In the emergency department, he was hemodynamically stable except for being hypoxic when patient's supplemental oxygen was turned off and the O2 sat dropped to 89%.  Workup in the ED showed normal CBC and BMP except for blood glucose of 128.  BNP 108, D-dimer 0.96.  Influenza A, B, SARS coronavirus 2, RSV was negative. CT angiography chest with contrast showed no evidence of pulmonary embolus.  Stable asbestos-related pleural disease.  Trace bilateral pleural effusions. Chest x-ray showed changes of chronic lung disease with no definite plain film evidence of acute, superimposed process. Breathing treatment was provided IV Solu-Medrol  125 mg was given, IV fentanyl 50 mcg x 1 was given.  TRH was asked to admit patient.  Review of Systems: Review of systems as noted in the HPI. All other systems reviewed and are negative.   Past Medical History:  Diagnosis Date   Asbestos exposure    Diabetes mellitus without complication (HCC)    Emphysema lung (HCC)    Past Surgical History:  Procedure Laterality Date   APPENDECTOMY      Social History:  reports that he quit  smoking about 45 years ago. His smoking use included cigarettes. He has never used smokeless tobacco. He reports that he does not drink alcohol and does not use drugs.   No Known Allergies  Family History  Problem Relation Age of Onset   Lung cancer Mother    Lung cancer Father    Kidney failure Brother      Prior to Admission medications   Medication Sig Start Date End Date Taking? Authorizing Provider  Ascorbic Acid (VITAMIN C) 100 MG tablet Take 1,000 mg by mouth daily.    [provider]  atorvastatin  (LIPITOR) 20 MG tablet Take 1 tablet (20 mg total) by mouth daily. 02/15/23   Dixon, Phillip E, MD  B Complex Vitamins (B COMPLEX 100 PO) Take 1 tablet by mouth daily.    [provider]  budesonide (PULMICORT) 0.5 MG/2ML nebulizer solution Take 0.5 mg by nebulization 2 (two) times daily.    [provider]  Cholecalciferol ([employer] VIT D 5000 HIGH-POTENCY PO) Take 1 tablet by mouth daily.    [provider]  ciclopirox  (PENLAC ) 8 % solution Apply topically at bedtime. Apply over nail and surrounding skin. Apply daily over previous coat. After seven (7) days, may remove with alcohol and continue cycle. 08/26/22   Sikora, Rebecca, DPM  coenzyme Q10-levOCARNitine (CO Q-10 PLUS) 100-20 MG CAPS Take 200 mg by mouth daily.    [provider]  famotidine  (PEPCID ) 20 MG tablet TAKE 1 TABLET BY MOUTH DAILY  AFTER SUPPER 05/28/23  Wert, Michael B, MD  ipratropium (ATROVENT) 0.06 % nasal spray Place 2 sprays into both nostrils 4 (four) times daily as needed for rhinitis.    [provider]  loperamide (IMODIUM A-D) 2 MG tablet Take 2 mg by mouth 4 (four) times daily as needed for diarrhea or loose stools.    [provider]  losartan  (COZAAR ) 25 MG tablet Take 1 tablet (25 mg total) by mouth daily. 11/10/19   Diamond Formica, MD  Magnesium 400 MG CAPS Take by mouth.    [provider]  pantoprazole  (PROTONIX ) 40 MG tablet TAKE 1 TABLET  BY MOUTH DAILY 30  TO 60 MINUTES BEFORE FIRST MEAL  OF THE DAY 02/15/23   Tobi Fortes, MD  PARoxetine  (PAXIL ) 30 MG tablet TAKE 1 TABLET BY MOUTH DAILY 03/02/23   Dixon, Phillip E, MD  triamcinolone  (NASACORT ) 55 MCG/ACT AERO nasal inhaler Place 2 sprays into the nose daily. 02/16/20   Wert, Michael B, MD  VITAMIN A PO Take 1 tablet by mouth daily.    [provider]  vitamin B-12 (CYANOCOBALAMIN ) 500 MCG tablet Take 500 mcg by mouth daily.    [provider]  vitamin E 180 MG (400 UNITS) capsule Take 400 Units by mouth daily.    [provider]    Physical Exam: BP (!) 103/55 (BP Location: Left Arm)   Pulse 71   Temp 97.8 F (36.6 C) (Oral)   Resp (!) 21   Ht 5\' 11"  (1.803 m)   Wt 66.6 kg   SpO2 91%   BMI 20.48 kg/m   General: 83 y.o. year-old male well developed well nourished in no acute distress.  Alert and oriented x3. HEENT: NCAT, EOMI Neck: Supple, trachea medial Cardiovascular: Regular rate and rhythm with no rubs or gallops.  No thyromegaly or JVD noted.  No lower extremity edema. 2/4 pulses in all 4 extremities. Respiratory: Diffuse rhonchi on auscultation with minimal rales in lower lobes bilaterally.  Abdomen: Soft, nontender nondistended with normal bowel sounds x4 quadrants. Muskuloskeletal: No cyanosis, clubbing or edema noted bilaterally Neuro: Bilateral tremors in hands (chronic).  CN II-XII intact, sensation, reflexes intact Skin: No ulcerative lesions noted or rashes Psychiatry: Judgement and insight appear normal. Mood is appropriate for condition and setting          Labs on Admission:  Basic Metabolic Panel: Recent Labs  Lab 07/28/23 2105  NA 137  K 4.6  CL 99  GLUCOSE 128*  BUN 18  CREATININE 0.70   Liver Function Tests: No results for input(s): "AST", "ALT", "ALKPHOS", "BILITOT", "PROT", "ALBUMIN" in the last 168 hours. No results for input(s): "LIPASE", "AMYLASE" in the last 168 hours. No results for input(s):  "AMMONIA" in the last 168 hours. CBC: Recent Labs  Lab 07/28/23 1722 07/28/23 2105  WBC 8.4  --   NEUTROABS 6.4  --   HGB 13.2 13.6  HCT 41.7 40.0  MCV 88.2  --   PLT 301  --    Cardiac Enzymes: No results for input(s): "CKTOTAL", "CKMB", "CKMBINDEX", "TROPONINI" in the last 168 hours.  BNP (last 3 results) Recent Labs    07/28/23 1722  BNP 108.0*    ProBNP (last 3 results) No results for input(s): "PROBNP" in the last 8760 hours.  CBG: No results for input(s): "GLUCAP" in the last 168 hours.  Radiological Exams on Admission: CT Angio Chest PE W and/or Wo Contrast Result Date: 07/28/2023 CLINICAL DATA:  Worsening shortness of breath since  yesterday, pain with inspiration, hypoxia EXAM: CT ANGIOGRAPHY CHEST WITH CONTRAST TECHNIQUE: Multidetector CT imaging of the chest was performed using the standard protocol during bolus administration of intravenous contrast. Multiplanar CT image reconstructions and MIPs were obtained to evaluate the vascular anatomy. RADIATION DOSE REDUCTION: This exam was performed according to the departmental dose-optimization program which includes automated exposure control, adjustment of the mA and/or kV according to patient size and/or use of iterative reconstruction technique. CONTRAST:  75mL OMNIPAQUE IOHEXOL 350 MG/ML SOLN COMPARISON:  07/28/2023, 02/17/2021 FINDINGS: Cardiovascular: This is a technically adequate evaluation of the pulmonary vasculature. No filling defects or pulmonary emboli. Mild cardiomegaly without pericardial effusion. No evidence of thoracic aortic aneurysm or dissection. Atherosclerosis of the aorta and coronary vasculature. Mediastinum/Nodes: No enlarged mediastinal, hilar, or axillary lymph nodes. Thyroid  gland, trachea, and esophagus demonstrate no significant findings. Lungs/Pleura: Stable upper lobe predominant emphysema. Stable bilateral calcified pleural plaques and circumferential pleural thickening greatest at the lung  bases, consistent with prior asbestos exposure. Areas of subpleural scarring are again noted at the lung bases. No acute airspace disease. Trace bilateral pleural effusions. No pneumothorax. Central airways are patent. Upper Abdomen: No acute abnormality. Musculoskeletal: No acute or destructive bony abnormalities. Reconstructed images demonstrate no additional findings. Review of the MIP images confirms the above findings. IMPRESSION: 1. No evidence of pulmonary embolus. 2. Stable asbestos related pleural disease. 3. Trace bilateral pleural effusions. 4. Aortic Atherosclerosis (ICD10-I70.0) and Emphysema (ICD10-J43.9). Electronically Signed   By: Bobbye Burrow M.D.   On: 07/28/2023 21:40   DG Chest Port 1 View Result Date: 07/28/2023 CLINICAL DATA:  Shortness of breath EXAM: PORTABLE CHEST 1 VIEW COMPARISON:  Chest x-ray performed May 10, 2023 FINDINGS: Similar appearance of right-sided pleural effusion and pleuroparenchymal thickening. Trace left effusion/pleuroparenchymal thickening. Underlying changes of chronic lung disease with interstitial changes in calcifications noted. Heart mediastinum are not significantly changed. Calcified pleural plaques. No pneumothorax. IMPRESSION: 1. Changes of chronic lung disease which are detailed above. No definite plain film evidence of acute, superimposed process. Electronically Signed   By: Reagan Camera M.D.   On: 07/28/2023 17:04    EKG: I independently viewed the EKG done and my findings are as followed: Normal sinus rhythm at a rate of 87 bpm with multiple VPCs  Assessment/Plan Present on Admission:  Acute exacerbation of chronic obstructive pulmonary disease (COPD) (HCC)  Acute respiratory failure with hypoxia (HCC)  GERD (gastroesophageal reflux disease)  Essential hypertension  Mixed hyperlipidemia  Principal Problem:   Acute exacerbation of chronic obstructive pulmonary disease (COPD) (HCC) Active Problems:   Diabetes mellitus without  complication (HCC)   Acute respiratory failure with hypoxia (HCC)   Essential hypertension   Mixed hyperlipidemia   GERD (gastroesophageal reflux disease)   Elevated brain natriuretic peptide (BNP) level   Bilateral pleural effusion   Elevated d-dimer   History of asbestosis   Acute exacerbation of COPD Acute respiratory failure with hypoxia Continue duo nebs, Mucinex , Solu-Medrol , azithromycin . Continue Protonix  to prevent steroid-induced ulcer Continue incentive spirometry and flutter valve Continue supplemental oxygen to maintain O2 sat > 94% with plan to wean patient off oxygen as tolerated  Elevated BNP, r/o acute CHF Bilateral pleural effusion BNP 108 Continue total input/output, daily weights and fluid restriction Continue IV Lasix 20 mg twice daily Continue heart healthy diet  Echocardiogram in the morning   Elevated D-dimer D-dimer 0.96 CT angiogram of her chest ruled out pulmonary embolism  History of asbestosis CT angiography of chest showed stable asbestos-related pleural  disease. Patient will continue follow-up with pulmonologist (Dr. Waymond Hailey) on discharge  T2DM Hemoglobin A1c on 02/15/2023 was 6.5 No antidiabetic medication noted on med rec Continue ISS and hypoglycemia protocol  GERD Continue Protonix   Essential hypertension Continue losartan , Lasix  Mixed hyperlipidemia Continue Lipitor   DVT prophylaxis: Lovenox   Code Status: Full code  Family Communication: Daughter at bedside (all questions answered to satisfaction)  Consults: None  Severity of Illness: The appropriate patient status for this patient is OBSERVATION. Observation status is judged to be reasonable and necessary in order to provide the required intensity of service to ensure the patient's safety. The patient's presenting symptoms, physical exam findings, and initial radiographic and laboratory data in the context of their medical condition is felt to place them at decreased risk  for further clinical deterioration. Furthermore, it is anticipated that the patient will be medically stable for discharge from the hospital within 2 midnights of admission.   Author: Freddrick Gladson, DO 07/29/2023 4:20 AM  For on call review [email or web address].

## 2023-07-29 ENCOUNTER — Observation Stay (HOSPITAL_COMMUNITY)

## 2023-07-29 ENCOUNTER — Other Ambulatory Visit (HOSPITAL_COMMUNITY): Payer: Self-pay | Admitting: *Deleted

## 2023-07-29 DIAGNOSIS — E782 Mixed hyperlipidemia: Secondary | ICD-10-CM | POA: Diagnosis not present

## 2023-07-29 DIAGNOSIS — J90 Pleural effusion, not elsewhere classified: Secondary | ICD-10-CM | POA: Insufficient documentation

## 2023-07-29 DIAGNOSIS — K219 Gastro-esophageal reflux disease without esophagitis: Secondary | ICD-10-CM | POA: Diagnosis not present

## 2023-07-29 DIAGNOSIS — E119 Type 2 diabetes mellitus without complications: Secondary | ICD-10-CM

## 2023-07-29 DIAGNOSIS — Z77090 Contact with and (suspected) exposure to asbestos: Secondary | ICD-10-CM | POA: Diagnosis not present

## 2023-07-29 DIAGNOSIS — Z79899 Other long term (current) drug therapy: Secondary | ICD-10-CM | POA: Diagnosis not present

## 2023-07-29 DIAGNOSIS — I951 Orthostatic hypotension: Secondary | ICD-10-CM | POA: Diagnosis not present

## 2023-07-29 DIAGNOSIS — R000 Tachycardia, unspecified: Secondary | ICD-10-CM | POA: Diagnosis not present

## 2023-07-29 DIAGNOSIS — E1165 Type 2 diabetes mellitus with hyperglycemia: Secondary | ICD-10-CM | POA: Diagnosis not present

## 2023-07-29 DIAGNOSIS — R0603 Acute respiratory distress: Secondary | ICD-10-CM

## 2023-07-29 DIAGNOSIS — J441 Chronic obstructive pulmonary disease with (acute) exacerbation: Secondary | ICD-10-CM | POA: Diagnosis not present

## 2023-07-29 DIAGNOSIS — Z8709 Personal history of other diseases of the respiratory system: Secondary | ICD-10-CM

## 2023-07-29 DIAGNOSIS — R7989 Other specified abnormal findings of blood chemistry: Secondary | ICD-10-CM | POA: Diagnosis not present

## 2023-07-29 DIAGNOSIS — J9601 Acute respiratory failure with hypoxia: Secondary | ICD-10-CM | POA: Diagnosis not present

## 2023-07-29 DIAGNOSIS — J9602 Acute respiratory failure with hypercapnia: Secondary | ICD-10-CM | POA: Diagnosis not present

## 2023-07-29 DIAGNOSIS — I10 Essential (primary) hypertension: Secondary | ICD-10-CM | POA: Diagnosis not present

## 2023-07-29 LAB — COMPREHENSIVE METABOLIC PANEL WITH GFR
ALT: 18 U/L (ref 0–44)
AST: 27 U/L (ref 15–41)
Albumin: 2.9 g/dL — ABNORMAL LOW (ref 3.5–5.0)
Alkaline Phosphatase: 102 U/L (ref 38–126)
Anion gap: 15 (ref 5–15)
BUN: 20 mg/dL (ref 8–23)
CO2: 21 mmol/L — ABNORMAL LOW (ref 22–32)
Calcium: 9 mg/dL (ref 8.9–10.3)
Chloride: 101 mmol/L (ref 98–111)
Creatinine, Ser: 0.98 mg/dL (ref 0.61–1.24)
GFR, Estimated: >60 mL/min (ref >60)
Glucose, Bld: 268 mg/dL — ABNORMAL HIGH (ref 70–99)
Potassium: 4 mmol/L (ref 3.5–5.1)
Sodium: 137 mmol/L (ref 135–145)
Total Bilirubin: 0.7 mg/dL (ref 0.0–1.2)
Total Protein: 7.3 g/dL (ref 6.5–8.1)

## 2023-07-29 LAB — CBC
HCT: 37.6 % — ABNORMAL LOW (ref 39.0–52.0)
Hemoglobin: 11.5 g/dL — ABNORMAL LOW (ref 13.0–17.0)
MCH: 27.1 pg (ref 26.0–34.0)
MCHC: 30.6 g/dL (ref 30.0–36.0)
MCV: 88.7 fL (ref 80.0–100.0)
Platelets: 243 10*3/uL (ref 150–400)
RBC: 4.24 MIL/uL (ref 4.22–5.81)
RDW: 15 % (ref 11.5–15.5)
WBC: 8.8 10*3/uL (ref 4.0–10.5)
nRBC: 0 % (ref 0.0–0.2)

## 2023-07-29 LAB — GLUCOSE, CAPILLARY
Glucose-Capillary: 242 mg/dL — ABNORMAL HIGH (ref 70–99)
Glucose-Capillary: 318 mg/dL — ABNORMAL HIGH (ref 70–99)
Glucose-Capillary: 123 mg/dL — ABNORMAL HIGH (ref 70–99)
Glucose-Capillary: 146 mg/dL — ABNORMAL HIGH (ref 70–99)

## 2023-07-29 LAB — MAGNESIUM: Magnesium: 2.1 mg/dL (ref 1.7–2.4)

## 2023-07-29 LAB — TSH: TSH: 0.565 u[IU]/mL (ref 0.350–4.500)

## 2023-07-29 LAB — CORTISOL: Cortisol, Plasma: 5.9 ug/dL

## 2023-07-29 LAB — PHOSPHORUS: Phosphorus: 4 mg/dL (ref 2.5–4.6)

## 2023-07-29 MED ORDER — ENOXAPARIN SODIUM 40 MG/0.4ML IJ SOSY
40.0000 mg | PREFILLED_SYRINGE | INTRAMUSCULAR | Status: DC
  Administered 2023-07-29 – 2023-07-31 (×3): 40 mg via SUBCUTANEOUS
  Filled 2023-07-29 (×3): qty 0.4

## 2023-07-29 MED ORDER — IPRATROPIUM-ALBUTEROL 0.5-2.5 (3) MG/3ML IN SOLN
3.0000 mL | Freq: Four times a day (QID) | RESPIRATORY_TRACT | Status: DC
  Administered 2023-07-29: 3 mL via RESPIRATORY_TRACT
  Filled 2023-07-29: qty 3

## 2023-07-29 MED ORDER — AZITHROMYCIN 250 MG PO TABS
500.0000 mg | ORAL_TABLET | Freq: Every day | ORAL | Status: AC
  Administered 2023-07-29: 500 mg via ORAL
  Filled 2023-07-29: qty 2

## 2023-07-29 MED ORDER — INSULIN ASPART 100 UNIT/ML IJ SOLN
0.0000 [IU] | Freq: Three times a day (TID) | INTRAMUSCULAR | Status: DC
  Administered 2023-07-29: 11 [IU] via SUBCUTANEOUS
  Administered 2023-07-29: 2 [IU] via SUBCUTANEOUS
  Administered 2023-07-29: 3 [IU] via SUBCUTANEOUS
  Administered 2023-07-30: 2 [IU] via SUBCUTANEOUS
  Administered 2023-07-30: 8 [IU] via SUBCUTANEOUS
  Administered 2023-07-30: 3 [IU] via SUBCUTANEOUS
  Administered 2023-07-31: 5 [IU] via SUBCUTANEOUS

## 2023-07-29 MED ORDER — INSULIN ASPART 100 UNIT/ML IJ SOLN: 0.0000 [IU] | Freq: Three times a day (TID) | INTRAMUSCULAR | Status: DC

## 2023-07-29 MED ORDER — INSULIN ASPART 100 UNIT/ML IJ SOLN
4.0000 [IU] | Freq: Three times a day (TID) | INTRAMUSCULAR | Status: DC
  Administered 2023-07-29 – 2023-07-31 (×6): 4 [IU] via SUBCUTANEOUS

## 2023-07-29 MED ORDER — FUROSEMIDE 10 MG/ML IJ SOLN
20.0000 mg | Freq: Two times a day (BID) | INTRAMUSCULAR | Status: DC
  Administered 2023-07-29: 20 mg via INTRAVENOUS
  Filled 2023-07-29: qty 2

## 2023-07-29 MED ORDER — ATORVASTATIN CALCIUM 20 MG PO TABS
20.0000 mg | ORAL_TABLET | Freq: Every day | ORAL | Status: DC
  Administered 2023-07-29 – 2023-07-31 (×3): 20 mg via ORAL
  Filled 2023-07-29 (×3): qty 1

## 2023-07-29 MED ORDER — PANTOPRAZOLE SODIUM 40 MG PO TBEC
40.0000 mg | DELAYED_RELEASE_TABLET | Freq: Every day | ORAL | Status: DC
  Administered 2023-07-29 – 2023-07-31 (×3): 40 mg via ORAL
  Filled 2023-07-29 (×3): qty 1

## 2023-07-29 MED ORDER — ACETAMINOPHEN 325 MG PO TABS: 650.0000 mg | ORAL_TABLET | Freq: Four times a day (QID) | ORAL | Status: DC | PRN

## 2023-07-29 MED ORDER — ONDANSETRON HCL 4 MG PO TABS: 4.0000 mg | ORAL_TABLET | Freq: Four times a day (QID) | ORAL | Status: DC | PRN

## 2023-07-29 MED ORDER — LOSARTAN POTASSIUM 25 MG PO TABS
25.0000 mg | ORAL_TABLET | Freq: Every day | ORAL | Status: DC
  Filled 2023-07-29: qty 1

## 2023-07-29 MED ORDER — FUROSEMIDE 10 MG/ML IJ SOLN: 20.0000 mg | Freq: Two times a day (BID) | INTRAMUSCULAR | Status: DC

## 2023-07-29 MED ORDER — LEVALBUTEROL HCL 0.63 MG/3ML IN NEBU
0.6300 mg | INHALATION_SOLUTION | Freq: Three times a day (TID) | RESPIRATORY_TRACT | Status: DC
  Administered 2023-07-29 (×2): 0.63 mg via RESPIRATORY_TRACT
  Filled 2023-07-29 (×2): qty 3

## 2023-07-29 MED ORDER — FAMOTIDINE 20 MG PO TABS
20.0000 mg | ORAL_TABLET | Freq: Every day | ORAL | Status: DC
  Administered 2023-07-29 – 2023-07-31 (×3): 20 mg via ORAL
  Filled 2023-07-29 (×3): qty 1

## 2023-07-29 MED ORDER — INSULIN GLARGINE-YFGN 100 UNIT/ML ~~LOC~~ SOLN
15.0000 [IU] | Freq: Every day | SUBCUTANEOUS | Status: DC
  Administered 2023-07-29 – 2023-07-31 (×3): 15 [IU] via SUBCUTANEOUS
  Filled 2023-07-29 (×4): qty 0.15

## 2023-07-29 MED ORDER — SODIUM CHLORIDE 0.9 % IV BOLUS
500.0000 mL | Freq: Once | INTRAVENOUS | Status: AC
  Administered 2023-07-29: 500 mL via INTRAVENOUS

## 2023-07-29 MED ORDER — ONDANSETRON HCL 4 MG/2ML IJ SOLN: 4.0000 mg | Freq: Four times a day (QID) | INTRAMUSCULAR | Status: DC | PRN

## 2023-07-29 MED ORDER — IPRATROPIUM BROMIDE 0.02 % IN SOLN
0.5000 mg | Freq: Three times a day (TID) | RESPIRATORY_TRACT | Status: DC
  Administered 2023-07-29 (×2): 0.5 mg via RESPIRATORY_TRACT
  Filled 2023-07-29 (×2): qty 2.5

## 2023-07-29 MED ORDER — LEVALBUTEROL HCL 0.63 MG/3ML IN NEBU: 0.6300 mg | INHALATION_SOLUTION | RESPIRATORY_TRACT | Status: DC | PRN

## 2023-07-29 MED ORDER — PAROXETINE HCL 20 MG PO TABS
30.0000 mg | ORAL_TABLET | Freq: Every day | ORAL | Status: DC
  Administered 2023-07-29 – 2023-07-31 (×3): 30 mg via ORAL
  Filled 2023-07-29 (×3): qty 1

## 2023-07-29 MED ORDER — INSULIN ASPART 100 UNIT/ML IJ SOLN: 0.0000 [IU] | Freq: Every day | INTRAMUSCULAR | Status: DC

## 2023-07-29 MED ORDER — ACETAMINOPHEN 650 MG RE SUPP: 650.0000 mg | Freq: Four times a day (QID) | RECTAL | Status: DC | PRN

## 2023-07-29 MED ORDER — AZITHROMYCIN 250 MG PO TABS
250.0000 mg | ORAL_TABLET | Freq: Every day | ORAL | Status: DC
  Administered 2023-07-30 – 2023-07-31 (×2): 250 mg via ORAL
  Filled 2023-07-29 (×2): qty 1

## 2023-07-29 MED ORDER — DM-GUAIFENESIN ER 30-600 MG PO TB12
1.0000 | ORAL_TABLET | Freq: Two times a day (BID) | ORAL | Status: DC
  Administered 2023-07-29: 1 via ORAL
  Filled 2023-07-29: qty 1

## 2023-07-29 MED ORDER — METHYLPREDNISOLONE SODIUM SUCC 40 MG IJ SOLR
40.0000 mg | Freq: Two times a day (BID) | INTRAMUSCULAR | Status: DC
  Administered 2023-07-29 – 2023-07-31 (×5): 40 mg via INTRAVENOUS
  Filled 2023-07-29 (×5): qty 1

## 2023-07-29 MED ORDER — LEVALBUTEROL HCL 0.63 MG/3ML IN NEBU
0.6300 mg | INHALATION_SOLUTION | Freq: Three times a day (TID) | RESPIRATORY_TRACT | Status: DC
Start: 2023-07-30 — End: 2023-07-31
  Administered 2023-07-30 – 2023-07-31 (×4): 0.63 mg via RESPIRATORY_TRACT
  Filled 2023-07-29 (×4): qty 3

## 2023-07-29 MED ORDER — IPRATROPIUM BROMIDE 0.02 % IN SOLN
0.5000 mg | Freq: Three times a day (TID) | RESPIRATORY_TRACT | Status: DC
  Administered 2023-07-30 – 2023-07-31 (×4): 0.5 mg via RESPIRATORY_TRACT
  Filled 2023-07-29 (×4): qty 2.5

## 2023-07-29 NOTE — Plan of Care (Signed)
  Problem: Education: Goal: Knowledge of General Education information will improve Description: Including pain rating scale, medication(s)/side effects and non-pharmacologic comfort measures Outcome: Progressing   Problem: Health Behavior/Discharge Planning: Goal: Ability to manage health-related needs will improve Outcome: Progressing   Problem: Clinical Measurements: Goal: Will remain free from infection Outcome: Progressing Goal: Diagnostic test results will improve Outcome: Progressing   Problem: Activity: Goal: Risk for activity intolerance will decrease Outcome: Progressing   Problem: Coping: Goal: Level of anxiety will decrease Outcome: Progressing   Problem: Pain Managment: Goal: General experience of comfort will improve and/or be controlled Outcome: Progressing   Problem: Skin Integrity: Goal: Risk for impaired skin integrity will decrease Outcome: Progressing

## 2023-07-29 NOTE — Plan of Care (Signed)

## 2023-07-29 NOTE — TOC CM/SW Note (Signed)
 Transition of Care [facility]) - Inpatient Brief Assessment   Patient Details  Name: Dominic [name] MRN: [id number] Date of Birth: September 10, 1940  Transition of Care [facility]) CM/SW Contact:    Cyndie Dredge, LCSWA Phone Number: 07/29/2023, 8:04 AM   Clinical Narrative:  Transition of Care Department [facility]) has reviewed patient and no TOC needs have been identified at this time. We will continue to monitor patient advancement through interdisciplinary progression rounds. If new patient transition needs arise, please place a TOC consult.  Transition of Care Asessment: Insurance and Status: Insurance coverage has been reviewed Patient has primary care physician: Yes Home environment has been reviewed: single family home with wife, daughter, granddaughter Prior level of function:: Independent Prior/Current Home Services: No current home services Social Drivers of Health Review: SDOH reviewed no interventions necessary Readmission risk has been reviewed: Yes Transition of care needs: no transition of care needs at this time

## 2023-07-29 NOTE — Progress Notes (Signed)
 PROGRESS NOTE  Dominic [name] ZOX:[id number] DOB: Aug 17, 1940   PCP: Tobi Fortes, MD  Patient is from: Home.  Lives with family.  Has rolling walker.  DOA: 07/28/2023 LOS: 0  Chief complaints Chief Complaint  Patient presents with   Shortness of Breath     Brief Narrative / Interim history: 83 year old M with PMH of ILD, asbestosis and COPD followed by Dr. Waymond Hailey, DM-2, HTN, dizziness, anxiety and depression presented to ED with several days of pleuritic chest pain worse with deep breathing, shortness of breath and dizziness.  Patient reports progressive dyspnea that has acutely gotten worse 2 days prior to presentation.  In ED, stable vitals.  CBC and CMP without significant finding.  BNP 108.  D-dimer 0.96.  COVID-19, influenza and RSV PCR nonreactive.  EKG with some PVCs.  CT angio chest with contrast negative for PE but stable asbestos-related pleural disease and trace bilateral pleural effusions.  Patient now started on IV Solu-Medrol , nebulizers, IV fentanyl and IV Lasix, and admitted with working diagnosis of acute respiratory failure with hypoxia in the setting of COPD exacerbation.  Echocardiogram ordered.  Subjective: Seen and examined earlier this morning.  No major events overnight or this morning.  Continues to endorse shortness of breath, DOE, dizziness and diffuse chest pain.  No runny nose, sore throat, fever, nausea or vomiting.  Does not take inhaler.  Quit smoking in 1990s.  Objective: Vitals:   07/29/23 1125 07/29/23 1130 07/29/23 1136 07/29/23 1200  BP: 97/67 100/84 (!) 88/52 98/68  Pulse: (!) 118 (!) 113 (!) 52 88  Resp:      Temp:    98.4 F (36.9 C)  TempSrc:    Oral  SpO2: 96% 96% 95% 94%  Weight:      Height:        Examination:  GENERAL: No apparent distress.  Nontoxic. HEENT: MMM.  Vision and hearing grossly intact.  NECK: Supple.  No apparent JVD.  RESP:  No IWOB.  Fair aeration bilaterally. CVS: Irregular rhythm.  HR 110s.  Heart sounds  normal.  ABD/GI/GU: BS+. Abd soft, NTND.  MSK/EXT:  Moves extremities. No apparent deformity. No edema.  SKIN: no apparent skin lesion or wound NEURO: Awake, alert and oriented appropriately.  No apparent focal neuro deficit. PSYCH: Appears anxious and jittery.  Consultants:  None  Procedures: None  Microbiology summarized: COVID-19, influenza and RSV PCR nonreactive  Assessment and plan: Dyspnea/dyspnea with exertion: Unclear if this is acute or progressive lung disease.  His PFT in 2021 with mild obstructive airway disease with minimal reversibility and possible restriction and moderate diffusion defect.  Patient is not on inhaler.  His BNP is only 108.  He appears dry but this is after he received IV Lasix.  He is also hypotensive.  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  Has no fever or leukocytosis.  COVID-19, influenza and RSV PCR nonreactive. - Continue IV Solu-Medrol  and Zithromax  - Change DuoNebs to Xopenex with Atrovent given tachycardia and PVCs. - Discontinue diuretics.  - Follow echocardiogram - Incentive spirometry, OOB, PT/OT  Orthostatic hypotension/History of hypertension: Looks like chronic issue.  On the losartan  25 mg daily.  Also on Paxil  which might contribute - Hold losartan  - Discontinue IV Lasix - NS bolus 500 cc x 1 - Follow echocardiogram - Check TSH   Diet controlled DM-2 with hyperglycemia: A1c 6.5% in 02/2023.  Hyperglycemia likely due to steroids. Recent Labs  Lab 07/29/23 [phone]  07/29/23 1114  GLUCAP 242* 318*  -Increase SSI to moderate -Add NovoLog  4 units 3 times daily with meals -Add Semglee 15 units daily -Further adjustment as appropriate -Recheck hemoglobin A1c  Sinus tachycardia with PVCs -Change albuterol  to Xopenex. -Full echocardiogram. -Check TSH  History of asbestosis: Stable on CT chest. -Outpatient follow-up with pulmonology, Dr. Waymond Hailey  Elevated D-dimer: Mild.  CT angio  chest negative for PE.  Low suspicion for VTE. GERD -Continue home Protonix  and Pepcid    Mixed hyperlipidemia Continue Lipitor  Anxiety and depression: appears a little anxious and jittery likely from steroid.  -Resume home Paxil   Body mass index is 20.48 kg/m.          DVT prophylaxis:  enoxaparin  (LOVENOX ) injection 40 mg Start: 07/29/23 1000 SCDs Start: 07/29/23 0006  Code Status: Full code Family Communication: None at bedside Level of care: Telemetry Status is: Observation The patient will require care spanning > 2 midnights and should be moved to inpatient because: Due to respiratory distress, orthostatic hypotension   Final disposition: Likely home once medically stable   55 minutes with more than 50% spent in reviewing records, counseling patient/family and coordinating care.   Sch Meds:  Scheduled Meds:  atorvastatin   20 mg Oral Daily   [START ON 07/30/2023] azithromycin   250 mg Oral Daily   enoxaparin  (LOVENOX ) injection  40 mg Subcutaneous Q24H   insulin  aspart  0-15 Units Subcutaneous TID WC   insulin  aspart  0-5 Units Subcutaneous QHS   insulin  aspart  4 Units Subcutaneous TID WC   insulin  glargine-yfgn  15 Units Subcutaneous Daily   levalbuterol  0.63 mg Nebulization Q8H   And   ipratropium  0.5 mg Nebulization Q8H   methylPREDNISolone  (SOLU-MEDROL ) injection  40 mg Intravenous Q12H   pantoprazole   40 mg Oral Daily   Continuous Infusions: PRN Meds:.acetaminophen **OR** acetaminophen, levalbuterol, ondansetron **OR** ondansetron (ZOFRAN) IV  Antimicrobials: Anti-infectives (From admission, onward)    Start     Dose/Rate Route Frequency Ordered Stop   07/30/23 1000  azithromycin  (ZITHROMAX ) tablet 250 mg       Placed in "Followed by" Linked Group   250 mg Oral Daily 07/29/23 0356 08/03/23 0959   07/29/23 1000  azithromycin  (ZITHROMAX ) tablet 500 mg       Placed in "Followed by" Linked Group   500 mg Oral Daily 07/29/23 0356 07/29/23 0807         I have personally reviewed the following labs and images: CBC: Recent Labs  Lab 07/28/23 1722 07/28/23 2105 07/29/23 0428  WBC 8.4  --  8.8  NEUTROABS 6.4  --   --   HGB 13.2 13.6 11.5*  HCT 41.7 40.0 37.6*  MCV 88.2  --  88.7  PLT 301  --  243   BMP &GFR Recent Labs  Lab 07/28/23 2105 07/29/23 0428  NA 137 137  K 4.6 4.0  CL 99 101  CO2  --  21*  GLUCOSE 128* 268*  BUN 18 20  CREATININE 0.70 0.98  CALCIUM   --  9.0  MG  --  2.1  PHOS  --  4.0   Estimated Creatinine Clearance: 53.8 mL/min (by C-G formula based on SCr of 0.98 mg/dL). Liver & Pancreas: Recent Labs  Lab 07/29/23 0428  AST 27  ALT 18  ALKPHOS 102  BILITOT 0.7  PROT 7.3  ALBUMIN 2.9*   No results for input(s): "LIPASE", "AMYLASE" in the last 168 hours. No results for input(s): "AMMONIA" in the last  168 hours. Diabetic: No results for input(s): "HGBA1C" in the last 72 hours. Recent Labs  Lab 07/29/23 0718 07/29/23 1114  GLUCAP 242* 318*   Cardiac Enzymes: No results for input(s): "CKTOTAL", "CKMB", "CKMBINDEX", "TROPONINI" in the last 168 hours. No results for input(s): "PROBNP" in the last 8760 hours. Coagulation Profile: No results for input(s): "INR", "PROTIME" in the last 168 hours. Thyroid  Function Tests: No results for input(s): "TSH", "T4TOTAL", "FREET4", "T3FREE", "THYROIDAB" in the last 72 hours. Lipid Profile: No results for input(s): "CHOL", "HDL", "LDLCALC", "TRIG", "CHOLHDL", "LDLDIRECT" in the last 72 hours. Anemia Panel: No results for input(s): "VITAMINB12", "FOLATE", "FERRITIN", "TIBC", "IRON", "RETICCTPCT" in the last 72 hours. Urine analysis: No results found for: "COLORURINE", "APPEARANCEUR", "LABSPEC", "PHURINE", "GLUCOSEU", "HGBUR", "BILIRUBINUR", "KETONESUR", "PROTEINUR", "UROBILINOGEN", "NITRITE", "LEUKOCYTESUR" Sepsis Labs: Invalid input(s): "PROCALCITONIN", "LACTICIDVEN"  Microbiology: Recent Results (from the past 240 hours)  Resp panel by RT-PCR (RSV,  Flu A&B, Covid) Anterior Nasal Swab     Status: None   Collection Time: 07/28/23  4:58 PM   Specimen: Anterior Nasal Swab  Result Value Ref Range Status   SARS Coronavirus 2 by RT PCR NEGATIVE NEGATIVE Final    Comment: (NOTE) SARS-CoV-2 target nucleic acids are NOT DETECTED.  The SARS-CoV-2 RNA is generally detectable in upper respiratory specimens during the acute phase of infection. The lowest concentration of SARS-CoV-2 viral copies this assay can detect is 138 copies/mL. A negative result does not preclude SARS-Cov-2 infection and should not be used as the sole basis for treatment or other patient management decisions. A negative result may occur with  improper specimen collection/handling, submission of specimen other than nasopharyngeal swab, presence of viral mutation(s) within the areas targeted by this assay, and inadequate number of viral copies(<138 copies/mL). A negative result must be combined with clinical observations, patient history, and epidemiological information. The expected result is Negative.  Fact Sheet for Patients:  BloggerCourse.com  Fact Sheet for Healthcare Providers:  SeriousBroker.it  This test is no t yet approved or cleared by the [place]  FDA and  has been authorized for detection and/or diagnosis of SARS-CoV-2 by FDA under an Emergency Use Authorization (EUA). This EUA will remain  in effect (meaning this test can be used) for the duration of the COVID-19 declaration under Section 564(b)(1) of the Act, 21 U.S.C.section 360bbb-3(b)(1), unless the authorization is terminated  or revoked sooner.       Influenza A by PCR NEGATIVE NEGATIVE Final   Influenza B by PCR NEGATIVE NEGATIVE Final    Comment: (NOTE) The Xpert Xpress SARS-CoV-2/FLU/RSV plus assay is intended as an aid in the diagnosis of influenza from Nasopharyngeal swab specimens and should not be used as a sole basis for  treatment. Nasal washings and aspirates are unacceptable for Xpert Xpress SARS-CoV-2/FLU/RSV testing.  Fact Sheet for Patients: BloggerCourse.com  Fact Sheet for Healthcare Providers: SeriousBroker.it  This test is not yet approved or cleared by the [place]  FDA and has been authorized for detection and/or diagnosis of SARS-CoV-2 by FDA under an Emergency Use Authorization (EUA). This EUA will remain in effect (meaning this test can be used) for the duration of the COVID-19 declaration under Section 564(b)(1) of the Act, 21 U.S.C. section 360bbb-3(b)(1), unless the authorization is terminated or revoked.     Resp Syncytial Virus by PCR NEGATIVE NEGATIVE Final    Comment: (NOTE) Fact Sheet for Patients: BloggerCourse.com  Fact Sheet for Healthcare Providers: SeriousBroker.it  This test is not yet approved or cleared by the [place]   FDA and has been authorized for detection and/or diagnosis of SARS-CoV-2 by FDA under an Emergency Use Authorization (EUA). This EUA will remain in effect (meaning this test can be used) for the duration of the COVID-19 declaration under Section 564(b)(1) of the Act, 21 U.S.C. section 360bbb-3(b)(1), unless the authorization is terminated or revoked.  Performed at [facility], [address], [place], [place] [address]     Radiology Studies: CT Angio Chest PE W and/or Wo Contrast Result Date: 07/28/2023 CLINICAL DATA:  Worsening shortness of breath since yesterday, pain with inspiration, hypoxia EXAM: CT ANGIOGRAPHY CHEST WITH CONTRAST TECHNIQUE: Multidetector CT imaging of the chest was performed using the standard protocol during bolus administration of intravenous contrast. Multiplanar CT image reconstructions and MIPs were obtained to evaluate the vascular anatomy. RADIATION DOSE REDUCTION: This exam was performed according to the  departmental dose-optimization program which includes automated exposure control, adjustment of the mA and/or kV according to patient size and/or use of iterative reconstruction technique. CONTRAST:  75mL OMNIPAQUE IOHEXOL 350 MG/ML SOLN COMPARISON:  07/28/2023, 02/17/2021 FINDINGS: Cardiovascular: This is a technically adequate evaluation of the pulmonary vasculature. No filling defects or pulmonary emboli. Mild cardiomegaly without pericardial effusion. No evidence of thoracic aortic aneurysm or dissection. Atherosclerosis of the aorta and coronary vasculature. Mediastinum/Nodes: No enlarged mediastinal, hilar, or axillary lymph nodes. Thyroid  gland, trachea, and esophagus demonstrate no significant findings. Lungs/Pleura: Stable upper lobe predominant emphysema. Stable bilateral calcified pleural plaques and circumferential pleural thickening greatest at the lung bases, consistent with prior asbestos exposure. Areas of subpleural scarring are again noted at the lung bases. No acute airspace disease. Trace bilateral pleural effusions. No pneumothorax. Central airways are patent. Upper Abdomen: No acute abnormality. Musculoskeletal: No acute or destructive bony abnormalities. Reconstructed images demonstrate no additional findings. Review of the MIP images confirms the above findings. IMPRESSION: 1. No evidence of pulmonary embolus. 2. Stable asbestos related pleural disease. 3. Trace bilateral pleural effusions. 4. Aortic Atherosclerosis (ICD10-I70.0) and Emphysema (ICD10-J43.9). Electronically Signed   By: Bobbye Burrow M.D.   On: 07/28/2023 21:40   DG Chest Port 1 View Result Date: 07/28/2023 CLINICAL DATA:  Shortness of breath EXAM: PORTABLE CHEST 1 VIEW COMPARISON:  Chest x-ray performed May 10, 2023 FINDINGS: Similar appearance of right-sided pleural effusion and pleuroparenchymal thickening. Trace left effusion/pleuroparenchymal thickening. Underlying changes of chronic lung disease with  interstitial changes in calcifications noted. Heart mediastinum are not significantly changed. Calcified pleural plaques. No pneumothorax. IMPRESSION: 1. Changes of chronic lung disease which are detailed above. No definite plain film evidence of acute, superimposed process. Electronically Signed   By: Reagan Camera M.D.   On: 07/28/2023 17:04      Gorgeous [place] T. Zaviyar Rahal [facility]  If 7PM-7AM, please contact night-coverage [email or web address] 07/29/2023, 12:32 PM  And Pepcid  continue home Protonix  and PepcidAppears a little anxious and jittery likely from steroid.

## 2023-07-29 NOTE — Care Management Obs Status (Signed)
 MEDICARE OBSERVATION STATUS NOTIFICATION   Patient Details  Name: Dominic [name] MRN: [id number] Date of Birth: 1940/10/12   Medicare Observation Status Notification Given:  Yes    Geraldina Klinefelter, RN 07/29/2023, 8:59 PM

## 2023-07-29 NOTE — Progress Notes (Signed)
 Patient eating lunch.     [name], RCS

## 2023-07-30 ENCOUNTER — Observation Stay (HOSPITAL_COMMUNITY)

## 2023-07-30 DIAGNOSIS — J9602 Acute respiratory failure with hypercapnia: Secondary | ICD-10-CM

## 2023-07-30 DIAGNOSIS — J441 Chronic obstructive pulmonary disease with (acute) exacerbation: Secondary | ICD-10-CM | POA: Diagnosis not present

## 2023-07-30 DIAGNOSIS — J9601 Acute respiratory failure with hypoxia: Secondary | ICD-10-CM | POA: Diagnosis not present

## 2023-07-30 DIAGNOSIS — I10 Essential (primary) hypertension: Secondary | ICD-10-CM | POA: Diagnosis not present

## 2023-07-30 DIAGNOSIS — I5031 Acute diastolic (congestive) heart failure: Secondary | ICD-10-CM | POA: Diagnosis not present

## 2023-07-30 LAB — RESPIRATORY PANEL BY PCR

## 2023-07-30 LAB — ECHOCARDIOGRAM COMPLETE
AR max vel: 2.4 cm2
AV Area VTI: 2.62 cm2
AV Area mean vel: 2.76 cm2
AV Mean grad: 4.8 mmHg
AV Peak grad: 10.2 mmHg
Ao pk vel: 1.6 m/s
Area-P 1/2: 2.91 cm2
Height: 71 in
S' Lateral: 2.05 cm
Weight: 2349.22 [oz_av]

## 2023-07-30 LAB — MAGNESIUM: Magnesium: 2.1 mg/dL (ref 1.7–2.4)

## 2023-07-30 LAB — BASIC METABOLIC PANEL WITH GFR
Anion gap: 8 (ref 5–15)
BUN: 23 mg/dL (ref 8–23)
CO2: 26 mmol/L (ref 22–32)
Calcium: 9.1 mg/dL (ref 8.9–10.3)
Chloride: 100 mmol/L (ref 98–111)
Creatinine, Ser: 0.68 mg/dL (ref 0.61–1.24)
GFR, Estimated: >60 mL/min (ref >60)
Glucose, Bld: 158 mg/dL — ABNORMAL HIGH (ref 70–99)
Potassium: 4.8 mmol/L (ref 3.5–5.1)
Sodium: 134 mmol/L — ABNORMAL LOW (ref 135–145)

## 2023-07-30 LAB — GLUCOSE, CAPILLARY
Glucose-Capillary: 153 mg/dL — ABNORMAL HIGH (ref 70–99)
Glucose-Capillary: 296 mg/dL — ABNORMAL HIGH (ref 70–99)
Glucose-Capillary: 146 mg/dL — ABNORMAL HIGH (ref 70–99)
Glucose-Capillary: 166 mg/dL — ABNORMAL HIGH (ref 70–99)

## 2023-07-30 LAB — HEMOGLOBIN A1C
Hgb A1c MFr Bld: 6.2 % — ABNORMAL HIGH (ref 4.8–5.6)
Mean Plasma Glucose: 131.24 mg/dL

## 2023-07-30 MED ORDER — BUDESONIDE 0.5 MG/2ML IN SUSP
0.5000 mg | Freq: Two times a day (BID) | RESPIRATORY_TRACT | Status: DC
  Administered 2023-07-30 – 2023-07-31 (×3): 0.5 mg via RESPIRATORY_TRACT
  Filled 2023-07-30 (×3): qty 2

## 2023-07-30 MED ORDER — ARFORMOTEROL TARTRATE 15 MCG/2ML IN NEBU
15.0000 ug | INHALATION_SOLUTION | Freq: Two times a day (BID) | RESPIRATORY_TRACT | Status: DC
  Administered 2023-07-30 – 2023-07-31 (×3): 15 ug via RESPIRATORY_TRACT
  Filled 2023-07-30 (×3): qty 2

## 2023-07-30 NOTE — Hospital Course (Signed)
 83 year old M with PMH of ILD, asbestosis and COPD followed by Dr. Waymond Hailey, DM-2, HTN, dizziness, anxiety and depression presented to ED with several days of pleuritic chest pain worse with deep breathing, shortness of breath and dizziness.  Patient reports progressive dyspnea that has acutely gotten worse 2 days prior to presentation.   In ED, stable vitals.  CBC and CMP without significant finding.  BNP 108.  D-dimer 0.96.  COVID-19, influenza and RSV PCR nonreactive.  EKG with some PVCs.  CT angio chest with contrast negative for PE but stable asbestos-related pleural disease and trace bilateral pleural effusions.  Patient now started on IV Solu-Medrol , nebulizers, IV fentanyl and IV Lasix, and admitted with working diagnosis of acute respiratory failure with hypoxia in the setting of COPD exacerbation

## 2023-07-30 NOTE — Plan of Care (Signed)

## 2023-07-30 NOTE — Progress Notes (Signed)
*  PRELIMINARY RESULTS* Echocardiogram 2D Echocardiogram has been performed.  Bernis Brisker 07/30/2023, 3:07 PM

## 2023-07-30 NOTE — Progress Notes (Signed)
 PROGRESS NOTE  Dominic [name] NWG:[id number] DOB: 06/13/1940 DOA: 07/28/2023 PCP: Tobi Fortes, MD  Brief History:  83 year old M with PMH of ILD, asbestosis and COPD followed by Dr. Waymond Hailey, DM-2, HTN, dizziness, anxiety and depression presented to ED with several days of pleuritic chest pain worse with deep breathing, shortness of breath and dizziness.  Patient reports progressive dyspnea that has acutely gotten worse 2 days prior to presentation.   In ED, stable vitals.  CBC and CMP without significant finding.  BNP 108.  D-dimer 0.96.  COVID-19, influenza and RSV PCR nonreactive.  EKG with some PVCs.  CT angio chest with contrast negative for PE but stable asbestos-related pleural disease and trace bilateral pleural effusions.  Patient now started on IV Solu-Medrol , nebulizers, IV fentanyl and IV Lasix, and admitted with working diagnosis of acute respiratory failure with hypoxia in the setting of COPD exacerbation   Assessment/Plan: Acute Respiratory failure with hypoxia and hypercarbia - 7.39/55/<31/32 -due to COD exacerbation -PFT in 2021 with mild obstructive airway disease with minimal reversibility and possible restriction and moderate diffusion defect.  Patient is not on inhaler.  His BNP is only 108.   -He appears dry but this is after he received IV Lasix.  He is also hypotensive.  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  Has no fever or leukocytosis.   -COVID-19, influenza and RSV PCR nonreactive. - viral resp panel neg - Continue IV Solu-Medrol  and Zithromax  - Change DuoNebs to Xopenex with Atrovent given tachycardia and PVCs. - Discontinue diuretics.  - Follow echocardiogram - Incentive spirometry, OOB, PT/OT   Orthostatic hypotension/History of hypertension:  - Hold losartan >>BP remains controlled - Discontinue IV Lasix - NS bolus 500 cc x 1 given - Follow echocardiogram - Check TSH 0.565   Diet controlled  DM-2 with hyperglycemia:  -07/30/23 A1C--6.2 -Continue NovoLog  4 units 3 times daily with meals -Continue Semglee 15 units daily -Further adjustment as appropriate   Sinus tachycardia with PVCs -Change albuterol  to Xopenex. -improved  History of asbestosis: Stable on CT chest. -Outpatient follow-up with pulmonology, Dr. Waymond Hailey   Elevated D-dimer:  -CT angio chest negative for PE.  Low suspicion for VTE. GERD -Continue home Protonix  and Pepcid    Mixed hyperlipidemia Continue Lipitor   Anxiety and depression:  -Resume home Paxil         Family Communication:  no Family at bedside  Consultants:  none  Code Status:  FULL   DVT Prophylaxis:  [place] Lovenox    Procedures: As Listed in Progress Note Above  Antibiotics: None       Subjective: Pt states he is breathing better.  Denies f/c, cp, n/v/d, abd pain.  CP is improving  Objective: Vitals:   07/30/23 0504 07/30/23 0740 07/30/23 0744 07/30/23 0752  BP:      Pulse:      Resp:      Temp:      TempSrc:      SpO2:  97% 100% 100%  Weight: 65.9 kg     Height:        Intake/Output Summary (Last 24 hours) at 07/30/2023 1257 Last data filed at 07/30/2023 0900 Gross per 24 hour  Intake 960 ml  Output --  Net 960 ml   Weight change: 0.998 kg Exam:  General:  Pt is alert, follows commands appropriately, not in acute distress HEENT: No icterus, No thrush, No neck mass, [place]/AT  Cardiovascular: RRR, S1/S2, no rubs, no gallops Respiratory: diminished BS.  Bibasilar rales.   Abdomen: Soft/+BS, non tender, non distended, no guarding Extremities: No edema, No lymphangitis, No petechiae, No rashes, no synovitis   Data Reviewed: I have personally reviewed following labs and imaging studies Basic Metabolic Panel: Recent Labs  Lab 07/28/23 2105 07/29/23 0428 07/30/23 0748  NA 137 137 134*  K 4.6 4.0 4.8  CL 99 101 100  CO2  --  21* 26  GLUCOSE 128* 268* 158*  BUN 18 20 23   CREATININE 0.70 0.98 0.68  CALCIUM    --  9.0 9.1  MG  --  2.1 2.1  PHOS  --  4.0  --    Liver Function Tests: Recent Labs  Lab 07/29/23 0428  AST 27  ALT 18  ALKPHOS 102  BILITOT 0.7  PROT 7.3  ALBUMIN 2.9*   No results for input(s): "LIPASE", "AMYLASE" in the last 168 hours. No results for input(s): "AMMONIA" in the last 168 hours. Coagulation Profile: No results for input(s): "INR", "PROTIME" in the last 168 hours. CBC: Recent Labs  Lab 07/28/23 1722 07/28/23 2105 07/29/23 0428  WBC 8.4  --  8.8  NEUTROABS 6.4  --   --   HGB 13.2 13.6 11.5*  HCT 41.7 40.0 37.6*  MCV 88.2  --  88.7  PLT 301  --  243   Cardiac Enzymes: No results for input(s): "CKTOTAL", "CKMB", "CKMBINDEX", "TROPONINI" in the last 168 hours. BNP: Invalid input(s): "POCBNP" CBG: Recent Labs  Lab 07/29/23 1114 07/29/23 1604 07/29/23 2058 07/30/23 0718 07/30/23 1111  GLUCAP 318* 123* 146* 153* 296*   HbA1C: Recent Labs    07/30/23 0748  HGBA1C 6.2*   Urine analysis: No results found for: "COLORURINE", "APPEARANCEUR", "LABSPEC", "PHURINE", "GLUCOSEU", "HGBUR", "BILIRUBINUR", "KETONESUR", "PROTEINUR", "UROBILINOGEN", "NITRITE", "LEUKOCYTESUR" Sepsis Labs: @LABRCNTIP (procalcitonin:4,lacticidven:4) ) Recent Results (from the past 240 hours)  Resp panel by RT-PCR (RSV, Flu A&B, Covid) Anterior Nasal Swab     Status: None   Collection Time: 07/28/23  4:58 PM   Specimen: Anterior Nasal Swab  Result Value Ref Range Status   SARS Coronavirus 2 by RT PCR NEGATIVE NEGATIVE Final    Comment: (NOTE) SARS-CoV-2 target nucleic acids are NOT DETECTED.  The SARS-CoV-2 RNA is generally detectable in upper respiratory specimens during the acute phase of infection. The lowest concentration of SARS-CoV-2 viral copies this assay can detect is 138 copies/mL. A negative result does not preclude SARS-Cov-2 infection and should not be used as the sole basis for treatment or other patient management decisions. A negative result may occur with   improper specimen collection/handling, submission of specimen other than nasopharyngeal swab, presence of viral mutation(s) within the areas targeted by this assay, and inadequate number of viral copies(<138 copies/mL). A negative result must be combined with clinical observations, patient history, and epidemiological information. The expected result is Negative.  Fact Sheet for Patients:  BloggerCourse.com  Fact Sheet for Healthcare Providers:  SeriousBroker.it  This test is no t yet approved or cleared by the [place]  FDA and  has been authorized for detection and/or diagnosis of SARS-CoV-2 by FDA under an Emergency Use Authorization (EUA). This EUA will remain  in effect (meaning this test can be used) for the duration of the COVID-19 declaration under Section 564(b)(1) of the Act, 21 U.S.C.section 360bbb-3(b)(1), unless the authorization is terminated  or revoked sooner.       Influenza A by PCR NEGATIVE NEGATIVE Final   Influenza B by  PCR NEGATIVE NEGATIVE Final    Comment: (NOTE) The Xpert Xpress SARS-CoV-2/FLU/RSV plus assay is intended as an aid in the diagnosis of influenza from Nasopharyngeal swab specimens and should not be used as a sole basis for treatment. Nasal washings and aspirates are unacceptable for Xpert Xpress SARS-CoV-2/FLU/RSV testing.  Fact Sheet for Patients: BloggerCourse.com  Fact Sheet for Healthcare Providers: SeriousBroker.it  This test is not yet approved or cleared by the [place]  FDA and has been authorized for detection and/or diagnosis of SARS-CoV-2 by FDA under an Emergency Use Authorization (EUA). This EUA will remain in effect (meaning this test can be used) for the duration of the COVID-19 declaration under Section 564(b)(1) of the Act, 21 U.S.C. section 360bbb-3(b)(1), unless the authorization is terminated or revoked.      Resp Syncytial Virus by PCR NEGATIVE NEGATIVE Final    Comment: (NOTE) Fact Sheet for Patients: BloggerCourse.com  Fact Sheet for Healthcare Providers: SeriousBroker.it  This test is not yet approved or cleared by the [place]  FDA and has been authorized for detection and/or diagnosis of SARS-CoV-2 by FDA under an Emergency Use Authorization (EUA). This EUA will remain in effect (meaning this test can be used) for the duration of the COVID-19 declaration under Section 564(b)(1) of the Act, 21 U.S.C. section 360bbb-3(b)(1), unless the authorization is terminated or revoked.  Performed at [facility], [address], [place], [place] [address]   Respiratory (~20 pathogens) panel by PCR     Status: None   Collection Time: 07/30/23  8:30 AM   Specimen: Nasopharyngeal Swab; Respiratory  Result Value Ref Range Status   Adenovirus NOT DETECTED NOT DETECTED Final   Coronavirus 229E NOT DETECTED NOT DETECTED Final    Comment: (NOTE) The Coronavirus on the Respiratory Panel, DOES NOT test for the novel  Coronavirus (2019 nCoV)    Coronavirus HKU1 NOT DETECTED NOT DETECTED Final   Coronavirus NL63 NOT DETECTED NOT DETECTED Final   Coronavirus OC43 NOT DETECTED NOT DETECTED Final   Metapneumovirus NOT DETECTED NOT DETECTED Final   Rhinovirus / Enterovirus NOT DETECTED NOT DETECTED Final   Influenza A NOT DETECTED NOT DETECTED Final   Influenza B NOT DETECTED NOT DETECTED Final   Parainfluenza Virus 1 NOT DETECTED NOT DETECTED Final   Parainfluenza Virus 2 NOT DETECTED NOT DETECTED Final   Parainfluenza Virus 3 NOT DETECTED NOT DETECTED Final   Parainfluenza Virus 4 NOT DETECTED NOT DETECTED Final   Respiratory Syncytial Virus NOT DETECTED NOT DETECTED Final   Bordetella pertussis NOT DETECTED NOT DETECTED Final   Bordetella Parapertussis NOT DETECTED NOT DETECTED Final   Chlamydophila pneumoniae NOT DETECTED NOT DETECTED Final    Mycoplasma pneumoniae NOT DETECTED NOT DETECTED Final    Comment: Performed at [facility], [address] [place], [place] [address]     Scheduled Meds:  arformoterol  15 mcg Nebulization BID   atorvastatin   20 mg Oral Daily   azithromycin   250 mg Oral Daily   budesonide (PULMICORT) nebulizer solution  0.5 mg Nebulization BID   enoxaparin  (LOVENOX ) injection  40 mg Subcutaneous Q24H   famotidine   20 mg Oral Daily   insulin  aspart  0-15 Units Subcutaneous TID WC   insulin  aspart  0-5 Units Subcutaneous QHS   insulin  aspart  4 Units Subcutaneous TID WC   insulin  glargine-yfgn  15 Units Subcutaneous Daily   levalbuterol  0.63 mg Nebulization TID   And   ipratropium  0.5 mg Nebulization TID   methylPREDNISolone  (  SOLU-MEDROL ) injection  40 mg Intravenous Q12H   pantoprazole   40 mg Oral Daily   PARoxetine   30 mg Oral Daily   Continuous Infusions:  Procedures/Studies: CT Angio Chest PE W and/or Wo Contrast Result Date: 07/28/2023 CLINICAL DATA:  Worsening shortness of breath since yesterday, pain with inspiration, hypoxia EXAM: CT ANGIOGRAPHY CHEST WITH CONTRAST TECHNIQUE: Multidetector CT imaging of the chest was performed using the standard protocol during bolus administration of intravenous contrast. Multiplanar CT image reconstructions and MIPs were obtained to evaluate the vascular anatomy. RADIATION DOSE REDUCTION: This exam was performed according to the departmental dose-optimization program which includes automated exposure control, adjustment of the mA and/or kV according to patient size and/or use of iterative reconstruction technique. CONTRAST:  75mL OMNIPAQUE IOHEXOL 350 MG/ML SOLN COMPARISON:  07/28/2023, 02/17/2021 FINDINGS: Cardiovascular: This is a technically adequate evaluation of the pulmonary vasculature. No filling defects or pulmonary emboli. Mild cardiomegaly without pericardial effusion. No evidence of thoracic aortic aneurysm or dissection. Atherosclerosis of the  aorta and coronary vasculature. Mediastinum/Nodes: No enlarged mediastinal, hilar, or axillary lymph nodes. Thyroid  gland, trachea, and esophagus demonstrate no significant findings. Lungs/Pleura: Stable upper lobe predominant emphysema. Stable bilateral calcified pleural plaques and circumferential pleural thickening greatest at the lung bases, consistent with prior asbestos exposure. Areas of subpleural scarring are again noted at the lung bases. No acute airspace disease. Trace bilateral pleural effusions. No pneumothorax. Central airways are patent. Upper Abdomen: No acute abnormality. Musculoskeletal: No acute or destructive bony abnormalities. Reconstructed images demonstrate no additional findings. Review of the MIP images confirms the above findings. IMPRESSION: 1. No evidence of pulmonary embolus. 2. Stable asbestos related pleural disease. 3. Trace bilateral pleural effusions. 4. Aortic Atherosclerosis (ICD10-I70.0) and Emphysema (ICD10-J43.9). Electronically Signed   By: Bobbye Burrow M.D.   On: 07/28/2023 21:40   DG Chest Port 1 View Result Date: 07/28/2023 CLINICAL DATA:  Shortness of breath EXAM: PORTABLE CHEST 1 VIEW COMPARISON:  Chest x-ray performed May 10, 2023 FINDINGS: Similar appearance of right-sided pleural effusion and pleuroparenchymal thickening. Trace left effusion/pleuroparenchymal thickening. Underlying changes of chronic lung disease with interstitial changes in calcifications noted. Heart mediastinum are not significantly changed. Calcified pleural plaques. No pneumothorax. IMPRESSION: 1. Changes of chronic lung disease which are detailed above. No definite plain film evidence of acute, superimposed process. Electronically Signed   By: Reagan Camera M.D.   On: 07/28/2023 17:04    Demaris Fillers, DO  [facility]  If 7PM-7AM, please contact night-coverage [email or web address] Password [email or web address] 07/30/2023, 12:57 PM   LOS: 0 days

## 2023-07-31 DIAGNOSIS — J441 Chronic obstructive pulmonary disease with (acute) exacerbation: Secondary | ICD-10-CM | POA: Diagnosis not present

## 2023-07-31 DIAGNOSIS — I10 Essential (primary) hypertension: Secondary | ICD-10-CM | POA: Diagnosis not present

## 2023-07-31 DIAGNOSIS — J9601 Acute respiratory failure with hypoxia: Secondary | ICD-10-CM | POA: Diagnosis not present

## 2023-07-31 LAB — GLUCOSE, CAPILLARY: Glucose-Capillary: 163 mg/dL — ABNORMAL HIGH (ref 70–99)

## 2023-07-31 MED ORDER — PREDNISONE 20 MG PO TABS: 50.0000 mg | ORAL_TABLET | Freq: Every day | ORAL | Status: DC

## 2023-07-31 MED ORDER — PREDNISONE 50 MG PO TABS: 50.0000 mg | ORAL_TABLET | Freq: Every day | ORAL | 0 refills | Status: DC

## 2023-07-31 MED ORDER — ALBUTEROL SULFATE HFA 108 (90 BASE) MCG/ACT IN AERS: 2.0000 | INHALATION_SPRAY | Freq: Four times a day (QID) | RESPIRATORY_TRACT | 1 refills | Status: DC | PRN

## 2023-07-31 MED ORDER — AZITHROMYCIN 250 MG PO TABS: 250.0000 mg | ORAL_TABLET | Freq: Every day | ORAL | 0 refills | Status: DC

## 2023-07-31 NOTE — Discharge Summary (Addendum)
 Physician Discharge Summary   Patient: Dominic [name] MRN: [id number] DOB: 1940-11-07  Admit date:     07/28/2023  Discharge date: 07/31/23  Discharge Physician: Myrtie Atkinson Luman Holway   PCP: Tobi Fortes, MD   Recommendations at discharge:   Please follow up with primary care provider within 1-2 weeks  Please repeat BMP and CBC in one week    Hospital Course: 83 year old M with PMH of ILD, asbestosis and COPD followed by Dr. Waymond Hailey, DM-2, HTN, dizziness, anxiety and depression presented to ED with several days of pleuritic chest pain worse with deep breathing, shortness of breath and dizziness.  Patient reports progressive dyspnea that has acutely gotten worse 2 days prior to presentation.   In ED, stable vitals.  CBC and CMP without significant finding.  BNP 108.  D-dimer 0.96.  COVID-19, influenza and RSV PCR nonreactive.  EKG with some PVCs.  CT angio chest with contrast negative for PE but stable asbestos-related pleural disease and trace bilateral pleural effusions.  Patient now started on IV Solu-Medrol , nebulizers, IV fentanyl  and IV Lasix , and admitted with working diagnosis of acute respiratory failure with hypoxia in the setting of COPD exacerbation  Assessment and Plan: Acute Respiratory failure with hypoxia and hypercarbia - 7.39/55/<31/32 -due to COD exacerbation -PFT in 2021 with mild obstructive airway disease with minimal reversibility and possible restriction and moderate diffusion defect.  Patient is not on inhaler.  His BNP is only 108.   -He appears dry but this is after he received IV Lasix .  He is also hypotensive.  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  Has no fever or leukocytosis.   -COVID-19, influenza and RSV PCR nonreactive. - viral resp panel neg - Continue IV Solu-Medrol  and Zithromax  - Change DuoNebs to Xopenex  with Atrovent  given tachycardia and PVCs. - Discontinue diuretics.  - 5/15 Echo EF 70-75%, normal WMA,  G1DD, normal RVF, trivial MR - ambulated on RA without desaturation <92% - d/c home with prednisone  50 mg x 3 days and 2 days azithro -d/c home with albuterol  rescue inhaler   Orthostatic hypotension/History of hypertension:  - Hold losartan >>BP remains controlled - Discontinued IV Lasix  - NS bolus 500 cc x 1 given - Follow echocardiogram - Check TSH 0.565 - 5/15 Echo EF 70-75%, normal WMA, G1DD, normal RVF, trivial MR   Diet controlled DM-2 with hyperglycemia:  -07/30/23 A1C--6.2 -Continue NovoLog  4 units 3 times daily with meals with IV steroids -Continue Semglee  15 units daily -Further adjustment as appropriate  Hypertension -stopped losartan  due to soft BPs -BPs remained well controlled -will not restart anti-HTN meds -follow up PCP after d/c   Sinus tachycardia with PVCs -Change albuterol  to Xopenex . -improved   History of asbestosis: Stable on CT chest. -Outpatient follow-up with pulmonology, Dr. Waymond Hailey   Elevated D-dimer:  -CT angio chest negative for PE.  Low suspicion for VTE. GERD -Continue home Protonix  and Pepcid    Mixed hyperlipidemia Continue Lipitor   Anxiety and depression:  -Resume home Paxil             Consultants: none Procedures performed: none  Disposition: Home Diet recommendation:  Carb modified diet DISCHARGE MEDICATION: Allergies as of 07/31/2023   No Known Allergies      Medication List     STOP taking these medications    losartan  25 MG tablet Commonly known as: Cozaar        TAKE these medications    albuterol  108 (90 Base) MCG/ACT inhaler  Commonly known as: VENTOLIN  HFA Inhale 2 puffs into the lungs every 6 (six) hours as needed for wheezing or shortness of breath.   atorvastatin  20 MG tablet Commonly known as: LIPITOR Take 1 tablet (20 mg total) by mouth daily.   azithromycin  250 MG tablet Commonly known as: ZITHROMAX  Take 1 tablet (250 mg total) by mouth daily. Start taking on: Aug 01, 2023   B COMPLEX 100  PO Take 1 tablet by mouth daily.   Co Q-10 Plus 100-20 MG Caps Generic drug: coenzyme Q10-levOCARNitine Take 200 mg by mouth daily.   [employer] VIT D 5000 HIGH-POTENCY PO Take 1 tablet by mouth 2 (two) times daily.   cyanocobalamin  500 MCG tablet Commonly known as: VITAMIN B12 Take 500 mcg by mouth daily.   famotidine  20 MG tablet Commonly known as: PEPCID  TAKE 1 TABLET BY MOUTH DAILY  AFTER SUPPER   guaiFENesin  600 MG 12 hr tablet Commonly known as: MUCINEX  Take 600 mg by mouth 2 (two) times daily.   ipratropium 0.06 % nasal spray Commonly known as: ATROVENT  Place 2 sprays into both nostrils 4 (four) times daily as needed for rhinitis.   loperamide 2 MG tablet Commonly known as: IMODIUM A-D Take 2 mg by mouth 4 (four) times daily as needed for diarrhea or loose stools.   Magnesium 400 MG Caps Take by mouth.   pantoprazole  40 MG tablet Commonly known as: PROTONIX  TAKE 1 TABLET BY MOUTH DAILY 30  TO 60 MINUTES BEFORE FIRST MEAL  OF THE DAY   PARoxetine  30 MG tablet Commonly known as: PAXIL  TAKE 1 TABLET BY MOUTH DAILY   predniSONE  50 MG tablet Commonly known as: DELTASONE  Take 1 tablet (50 mg total) by mouth daily with breakfast. Start taking on: Aug 01, 2023   VITAMIN A PO Take 1 tablet by mouth daily.   vitamin C 100 MG tablet Take 1,000 mg by mouth daily.   vitamin E 180 MG (400 UNITS) capsule Take 400 Units by mouth daily.        Discharge Exam: Filed Weights   07/29/23 0104 07/30/23 0504 07/31/23 0446  Weight: 66.6 kg 65.9 kg 65.3 kg   HEENT:  [place]/AT, No thrush, no icterus CV:  RRR, no rub, no S3, no S4 Lung:  bibasilar rales.  No wheeze Abd:  soft/+BS, NT Ext:  No edema, no lymphangitis, no synovitis, no rash   Condition at discharge: stable  The results of significant diagnostics from this hospitalization (including imaging, microbiology, ancillary and laboratory) are listed below for reference.   Imaging Studies: ECHOCARDIOGRAM  COMPLETE Result Date: 07/30/2023    ECHOCARDIOGRAM REPORT   Patient Name:   Dominic [name] Date of Exam: 07/30/2023 Medical Rec #:  [id number]      Height:       71.0 in Accession #:    [id number]     Weight:       145.2 lb Date of Birth:  12/07/1940       BSA:          1.840 m Patient Age:    82 years       BP:           108/63 mmHg Patient Gender: M              HR:           93 bpm. Exam Location:  [facility] Procedure: 2D Echo, Cardiac Doppler and Color Doppler (Both Spectral and Color  Flow Doppler were utilized during procedure). Indications:    CHF-Acute Diastolic I50.31  History:        Patient has prior history of Echocardiogram examinations, most                 recent 03/24/2023. COPD; Risk Factors:Hypertension, Diabetes and                 Dyslipidemia.  Sonographer:    Denese Finn RCS Referring Phys: [id number] OLADAPO ADEFESO  Sonographer Comments: Technically difficult study due to poor echo windows and suboptimal subcostal window. IMPRESSIONS  1. Left ventricular ejection fraction, by estimation, is 70 to 75%. The left ventricle has hyperdynamic function. The left ventricle has no regional wall motion abnormalities. There is mild left ventricular hypertrophy. Left ventricular diastolic parameters are consistent with Grade I diastolic dysfunction (impaired relaxation).  2. Right ventricular systolic function is normal. The right ventricular size is normal.  3. The mitral valve is normal in structure. Trivial mitral valve regurgitation. No evidence of mitral stenosis.  4. The aortic valve is tricuspid. Aortic valve regurgitation is not visualized. No aortic stenosis is present.  5. The inferior vena cava is normal in size with greater than 50% respiratory variability, suggesting right atrial pressure of 3 mmHg. FINDINGS  Left Ventricle: Left ventricular ejection fraction, by estimation, is 70 to 75%. The left ventricle has hyperdynamic function. The left ventricle has no regional wall motion  abnormalities. The left ventricular internal cavity size was normal in size. There is mild left ventricular hypertrophy. Left ventricular diastolic parameters are consistent with Grade I diastolic dysfunction (impaired relaxation). Normal left ventricular filling pressure. Right Ventricle: The right ventricular size is normal. Right vetricular wall thickness was not well visualized. Right ventricular systolic function is normal. Left Atrium: Left atrial size was normal in size. Right Atrium: Right atrial size was normal in size. Pericardium: There is no evidence of pericardial effusion. Mitral Valve: The mitral valve is normal in structure. Trivial mitral valve regurgitation. No evidence of mitral valve stenosis. Tricuspid Valve: The tricuspid valve is normal in structure. Tricuspid valve regurgitation is not demonstrated. No evidence of tricuspid stenosis. Aortic Valve: The aortic valve is tricuspid. Aortic valve regurgitation is not visualized. No aortic stenosis is present. Aortic valve mean gradient measures 4.8 mmHg. Aortic valve peak gradient measures 10.2 mmHg. Aortic valve area, by VTI measures 2.62  cm. Pulmonic Valve: The pulmonic valve was not well visualized. Pulmonic valve regurgitation is not visualized. No evidence of pulmonic stenosis. Aorta: The aortic root is normal in size and structure. Venous: The inferior vena cava is normal in size with greater than 50% respiratory variability, suggesting right atrial pressure of 3 mmHg. IAS/Shunts: The interatrial septum was not well visualized.  LEFT VENTRICLE PLAX 2D LVIDd:         3.30 cm   Diastology LVIDs:         2.05 cm   LV e' medial:    6.96 cm/s LV PW:         1.20 cm   LV E/e' medial:  10.7 LV IVS:        1.20 cm   LV e' lateral:   10.30 cm/s LVOT diam:     2.00 cm   LV E/e' lateral: 7.2 LV SV:         74 LV SV Index:   40 LVOT Area:     3.14 cm  RIGHT VENTRICLE RV S prime:     19.60 cm/s  TAPSE (M-mode): 2.6 cm LEFT ATRIUM             Index         RIGHT ATRIUM           Index LA diam:        3.95 cm 2.15 cm/m   RA Area:     13.90 cm LA Vol (A2C):   41.7 ml 22.66 ml/m  RA Volume:   33.90 ml  18.42 ml/m LA Vol (A4C):   49.0 ml 26.63 ml/m LA Biplane Vol: 45.2 ml 24.56 ml/m  AORTIC VALVE AV Area (Vmax):    2.40 cm AV Area (Vmean):   2.76 cm AV Area (VTI):     2.62 cm AV Vmax:           159.63 cm/s AV Vmean:          102.002 cm/s AV VTI:            0.284 m AV Peak Grad:      10.2 mmHg AV Mean Grad:      4.8 mmHg LVOT Vmax:         122.00 cm/s LVOT Vmean:        89.700 cm/s LVOT VTI:          0.237 m LVOT/AV VTI ratio: 0.84  AORTA Ao Root diam: 3.50 cm MITRAL VALVE MV Area (PHT): 2.91 cm     SHUNTS MV Decel Time: 261 msec     Systemic VTI:  0.24 m MV E velocity: 74.60 cm/s   Systemic Diam: 2.00 cm MV A velocity: 101.00 cm/s MV E/A ratio:  0.74 Armida Lander MD Electronically signed by Armida Lander MD Signature Date/Time: 07/30/2023/3:22:33 PM    Final    CT Angio Chest PE W and/or Wo Contrast Result Date: 07/28/2023 CLINICAL DATA:  Worsening shortness of breath since yesterday, pain with inspiration, hypoxia EXAM: CT ANGIOGRAPHY CHEST WITH CONTRAST TECHNIQUE: Multidetector CT imaging of the chest was performed using the standard protocol during bolus administration of intravenous contrast. Multiplanar CT image reconstructions and MIPs were obtained to evaluate the vascular anatomy. RADIATION DOSE REDUCTION: This exam was performed according to the departmental dose-optimization program which includes automated exposure control, adjustment of the mA and/or kV according to patient size and/or use of iterative reconstruction technique. CONTRAST:  75mL OMNIPAQUE  IOHEXOL  350 MG/ML SOLN COMPARISON:  07/28/2023, 02/17/2021 FINDINGS: Cardiovascular: This is a technically adequate evaluation of the pulmonary vasculature. No filling defects or pulmonary emboli. Mild cardiomegaly without pericardial effusion. No evidence of thoracic aortic aneurysm or  dissection. Atherosclerosis of the aorta and coronary vasculature. Mediastinum/Nodes: No enlarged mediastinal, hilar, or axillary lymph nodes. Thyroid  gland, trachea, and esophagus demonstrate no significant findings. Lungs/Pleura: Stable upper lobe predominant emphysema. Stable bilateral calcified pleural plaques and circumferential pleural thickening greatest at the lung bases, consistent with prior asbestos exposure. Areas of subpleural scarring are again noted at the lung bases. No acute airspace disease. Trace bilateral pleural effusions. No pneumothorax. Central airways are patent. Upper Abdomen: No acute abnormality. Musculoskeletal: No acute or destructive bony abnormalities. Reconstructed images demonstrate no additional findings. Review of the MIP images confirms the above findings. IMPRESSION: 1. No evidence of pulmonary embolus. 2. Stable asbestos related pleural disease. 3. Trace bilateral pleural effusions. 4. Aortic Atherosclerosis (ICD10-I70.0) and Emphysema (ICD10-J43.9). Electronically Signed   By: Bobbye Burrow M.D.   On: 07/28/2023 21:40   DG Chest Port 1 View Result Date: 07/28/2023 CLINICAL DATA:  Shortness of breath EXAM: PORTABLE  CHEST 1 VIEW COMPARISON:  Chest x-ray performed May 10, 2023 FINDINGS: Similar appearance of right-sided pleural effusion and pleuroparenchymal thickening. Trace left effusion/pleuroparenchymal thickening. Underlying changes of chronic lung disease with interstitial changes in calcifications noted. Heart mediastinum are not significantly changed. Calcified pleural plaques. No pneumothorax. IMPRESSION: 1. Changes of chronic lung disease which are detailed above. No definite plain film evidence of acute, superimposed process. Electronically Signed   By: Reagan Camera M.D.   On: 07/28/2023 17:04    Microbiology: Results for orders placed or performed during the hospital encounter of 07/28/23  Resp panel by RT-PCR (RSV, Flu A&B, Covid) Anterior Nasal Swab      Status: None   Collection Time: 07/28/23  4:58 PM   Specimen: Anterior Nasal Swab  Result Value Ref Range Status   SARS Coronavirus 2 by RT PCR NEGATIVE NEGATIVE Final    Comment: (NOTE) SARS-CoV-2 target nucleic acids are NOT DETECTED.  The SARS-CoV-2 RNA is generally detectable in upper respiratory specimens during the acute phase of infection. The lowest concentration of SARS-CoV-2 viral copies this assay can detect is 138 copies/mL. A negative result does not preclude SARS-Cov-2 infection and should not be used as the sole basis for treatment or other patient management decisions. A negative result may occur with  improper specimen collection/handling, submission of specimen other than nasopharyngeal swab, presence of viral mutation(s) within the areas targeted by this assay, and inadequate number of viral copies(<138 copies/mL). A negative result must be combined with clinical observations, patient history, and epidemiological information. The expected result is Negative.  Fact Sheet for Patients:  BloggerCourse.com  Fact Sheet for Healthcare Providers:  SeriousBroker.it  This test is no t yet approved or cleared by the [place]  FDA and  has been authorized for detection and/or diagnosis of SARS-CoV-2 by FDA under an Emergency Use Authorization (EUA). This EUA will remain  in effect (meaning this test can be used) for the duration of the COVID-19 declaration under Section 564(b)(1) of the Act, 21 U.S.C.section 360bbb-3(b)(1), unless the authorization is terminated  or revoked sooner.       Influenza A by PCR NEGATIVE NEGATIVE Final   Influenza B by PCR NEGATIVE NEGATIVE Final    Comment: (NOTE) The Xpert Xpress SARS-CoV-2/FLU/RSV plus assay is intended as an aid in the diagnosis of influenza from Nasopharyngeal swab specimens and should not be used as a sole basis for treatment. Nasal washings and aspirates are  unacceptable for Xpert Xpress SARS-CoV-2/FLU/RSV testing.  Fact Sheet for Patients: BloggerCourse.com  Fact Sheet for Healthcare Providers: SeriousBroker.it  This test is not yet approved or cleared by the [place]  FDA and has been authorized for detection and/or diagnosis of SARS-CoV-2 by FDA under an Emergency Use Authorization (EUA). This EUA will remain in effect (meaning this test can be used) for the duration of the COVID-19 declaration under Section 564(b)(1) of the Act, 21 U.S.C. section 360bbb-3(b)(1), unless the authorization is terminated or revoked.     Resp Syncytial Virus by PCR NEGATIVE NEGATIVE Final    Comment: (NOTE) Fact Sheet for Patients: BloggerCourse.com  Fact Sheet for Healthcare Providers: SeriousBroker.it  This test is not yet approved or cleared by the [place]  FDA and has been authorized for detection and/or diagnosis of SARS-CoV-2 by FDA under an Emergency Use Authorization (EUA). This EUA will remain in effect (meaning this test can be used) for the duration of the COVID-19 declaration under Section 564(b)(1) of the Act, 21 U.S.C. section 360bbb-3(b)(1), unless the authorization  is terminated or revoked.  Performed at [facility], [address], [place], [place] [address]   Respiratory (~20 pathogens) panel by PCR     Status: None   Collection Time: 07/30/23  8:30 AM   Specimen: Nasopharyngeal Swab; Respiratory  Result Value Ref Range Status   Adenovirus NOT DETECTED NOT DETECTED Final   Coronavirus 229E NOT DETECTED NOT DETECTED Final    Comment: (NOTE) The Coronavirus on the Respiratory Panel, DOES NOT test for the novel  Coronavirus (2019 nCoV)    Coronavirus HKU1 NOT DETECTED NOT DETECTED Final   Coronavirus NL63 NOT DETECTED NOT DETECTED Final   Coronavirus OC43 NOT DETECTED NOT DETECTED Final   Metapneumovirus NOT DETECTED  NOT DETECTED Final   Rhinovirus / Enterovirus NOT DETECTED NOT DETECTED Final   Influenza A NOT DETECTED NOT DETECTED Final   Influenza B NOT DETECTED NOT DETECTED Final   Parainfluenza Virus 1 NOT DETECTED NOT DETECTED Final   Parainfluenza Virus 2 NOT DETECTED NOT DETECTED Final   Parainfluenza Virus 3 NOT DETECTED NOT DETECTED Final   Parainfluenza Virus 4 NOT DETECTED NOT DETECTED Final   Respiratory Syncytial Virus NOT DETECTED NOT DETECTED Final   Bordetella pertussis NOT DETECTED NOT DETECTED Final   Bordetella Parapertussis NOT DETECTED NOT DETECTED Final   Chlamydophila pneumoniae NOT DETECTED NOT DETECTED Final   Mycoplasma pneumoniae NOT DETECTED NOT DETECTED Final    Comment: Performed at [facility], [address] [place], [place] [address]    Labs: CBC: Recent Labs  Lab 07/28/23 1722 07/28/23 2105 07/29/23 0428  WBC 8.4  --  8.8  NEUTROABS 6.4  --   --   HGB 13.2 13.6 11.5*  HCT 41.7 40.0 37.6*  MCV 88.2  --  88.7  PLT 301  --  243   Basic Metabolic Panel: Recent Labs  Lab 07/28/23 2105 07/29/23 0428 07/30/23 0748  NA 137 137 134*  K 4.6 4.0 4.8  CL 99 101 100  CO2  --  21* 26  GLUCOSE 128* 268* 158*  BUN 18 20 23   CREATININE 0.70 0.98 0.68  CALCIUM   --  9.0 9.1  MG  --  2.1 2.1  PHOS  --  4.0  --    Liver Function Tests: Recent Labs  Lab 07/29/23 0428  AST 27  ALT 18  ALKPHOS 102  BILITOT 0.7  PROT 7.3  ALBUMIN 2.9*   CBG: Recent Labs  Lab 07/30/23 0718 07/30/23 1111 07/30/23 1610 07/30/23 2052 07/31/23 0730  GLUCAP 153* 296* 146* 166* 163*    Discharge time spent: greater than 30 minutes.  Signed: Demaris Fillers, MD [facility] 07/31/2023

## 2023-07-31 NOTE — Plan of Care (Signed)
 Problem: Education: Goal: Knowledge of General Education information will improve Description: Including pain rating scale, medication(s)/side effects and non-pharmacologic comfort measures 07/31/2023 1126 by Goldie Later, RN Outcome: Adequate for Discharge 07/31/2023 0800 by Goldie Later, RN Outcome: Progressing   Problem: Health Behavior/Discharge Planning: Goal: Ability to manage health-related needs will improve 07/31/2023 1126 by Encarnacion Bole, [place]everne [place], RN Outcome: Adequate for Discharge 07/31/2023 0800 by Goldie Later, RN Outcome: Progressing   Problem: Clinical Measurements: Goal: Ability to maintain clinical measurements within normal limits will improve 07/31/2023 1126 by Goldie Later, RN Outcome: Adequate for Discharge 07/31/2023 0800 by Goldie Later, RN Outcome: Progressing Goal: Will remain free from infection 07/31/2023 1126 by Goldie Later, RN Outcome: Adequate for Discharge 07/31/2023 0800 by Goldie Later, RN Outcome: Progressing Goal: Diagnostic test results will improve 07/31/2023 1126 by Goldie Later, RN Outcome: Adequate for Discharge 07/31/2023 0800 by Goldie Later, RN Outcome: Progressing Goal: Respiratory complications will improve 07/31/2023 1126 by Goldie Later, RN Outcome: Adequate for Discharge 07/31/2023 0800 by Goldie Later, RN Outcome: Progressing Goal: Cardiovascular complication will be avoided 07/31/2023 1126 by Goldie Later, RN Outcome: Adequate for Discharge 07/31/2023 0800 by Goldie Later, RN Outcome: Progressing   Problem: Activity: Goal: Risk for activity intolerance will decrease 07/31/2023 1126 by Goldie Later, RN Outcome: Adequate for Discharge 07/31/2023 0800 by Goldie Later, RN Outcome: Progressing   Problem: Nutrition: Goal: Adequate nutrition will be maintained 07/31/2023 1126 by Goldie Later, RN Outcome: Adequate for Discharge 07/31/2023 0800 by Goldie Later, RN Outcome: Progressing   Problem: Coping: Goal: Level of anxiety  will decrease 07/31/2023 1126 by Goldie Later, RN Outcome: Adequate for Discharge 07/31/2023 0800 by Goldie Later, RN Outcome: Progressing   Problem: Elimination: Goal: Will not experience complications related to bowel motility 07/31/2023 1126 by Goldie Later, RN Outcome: Adequate for Discharge 07/31/2023 0800 by Goldie Later, RN Outcome: Progressing Goal: Will not experience complications related to urinary retention 07/31/2023 1126 by Goldie Later, RN Outcome: Adequate for Discharge 07/31/2023 0800 by Goldie Later, RN Outcome: Progressing   Problem: Pain Managment: Goal: General experience of comfort will improve and/or be controlled 07/31/2023 1126 by Goldie Later, RN Outcome: Adequate for Discharge 07/31/2023 0800 by Goldie Later, RN Outcome: Progressing   Problem: Safety: Goal: Ability to remain free from injury will improve 07/31/2023 1126 by Goldie Later, RN Outcome: Adequate for Discharge 07/31/2023 0800 by Goldie Later, RN Outcome: Progressing   Problem: Skin Integrity: Goal: Risk for impaired skin integrity will decrease 07/31/2023 1126 by Goldie Later, RN Outcome: Adequate for Discharge 07/31/2023 0800 by Goldie Later, RN Outcome: Progressing   Problem: Education: Goal: Ability to describe self-care measures that may prevent or decrease complications (Diabetes Survival Skills Education) will improve 07/31/2023 1126 by Goldie Later, RN Outcome: Adequate for Discharge 07/31/2023 0800 by Goldie Later, RN Outcome: Progressing Goal: Individualized Educational Video(s) 07/31/2023 1126 by Goldie Later, RN Outcome: Adequate for Discharge 07/31/2023 0800 by Goldie Later, RN Outcome: Progressing   Problem: Coping: Goal: Ability to adjust to condition or change in health will improve 07/31/2023 1126 by Goldie Later, RN Outcome: Adequate for Discharge 07/31/2023 0800 by Goldie Later, RN Outcome: Progressing   Problem: Fluid Volume: Goal: Ability to maintain  a balanced intake and output will improve 07/31/2023 1126 by Goldie Later, RN Outcome: Adequate for Discharge 07/31/2023  0800 by Goldie Later, RN Outcome: Progressing   Problem: Health Behavior/Discharge Planning: Goal: Ability to identify and utilize available resources and services will improve 07/31/2023 1126 by Aslan Montagna, Leverne [place], RN Outcome: Adequate for Discharge 07/31/2023 0800 by Goldie Later, RN Outcome: Progressing Goal: Ability to manage health-related needs will improve 07/31/2023 1126 by Lachae Hohler, Leverne [place], RN Outcome: Adequate for Discharge 07/31/2023 0800 by Goldie Later, RN Outcome: Progressing   Problem: Metabolic: Goal: Ability to maintain appropriate glucose levels will improve 07/31/2023 1126 by Goldie Later, RN Outcome: Adequate for Discharge 07/31/2023 0800 by Goldie Later, RN Outcome: Progressing   Problem: Nutritional: Goal: Maintenance of adequate nutrition will improve 07/31/2023 1126 by Goldie Later, RN Outcome: Adequate for Discharge 07/31/2023 0800 by Goldie Later, RN Outcome: Progressing Goal: Progress toward achieving an optimal weight will improve 07/31/2023 1126 by Goldie Later, RN Outcome: Adequate for Discharge 07/31/2023 0800 by Goldie Later, RN Outcome: Progressing   Problem: Skin Integrity: Goal: Risk for impaired skin integrity will decrease 07/31/2023 1126 by Goldie Later, RN Outcome: Adequate for Discharge 07/31/2023 0800 by Goldie Later, RN Outcome: Progressing   Problem: Tissue Perfusion: Goal: Adequacy of tissue perfusion will improve 07/31/2023 1126 by Goldie Later, RN Outcome: Adequate for Discharge 07/31/2023 0800 by Goldie Later, RN Outcome: Progressing

## 2023-07-31 NOTE — Plan of Care (Signed)

## 2023-08-10 ENCOUNTER — Encounter: Payer: Self-pay | Admitting: Internal Medicine

## 2023-08-10 ENCOUNTER — Ambulatory Visit (INDEPENDENT_AMBULATORY_CARE_PROVIDER_SITE_OTHER): Admitting: Internal Medicine

## 2023-08-10 VITALS — BP 106/59 | HR 85 | Ht 71.0 in | Wt 149.4 lb

## 2023-08-10 DIAGNOSIS — E782 Mixed hyperlipidemia: Secondary | ICD-10-CM

## 2023-08-10 DIAGNOSIS — I10 Essential (primary) hypertension: Secondary | ICD-10-CM

## 2023-08-10 DIAGNOSIS — F419 Anxiety disorder, unspecified: Secondary | ICD-10-CM

## 2023-08-10 DIAGNOSIS — J449 Chronic obstructive pulmonary disease, unspecified: Secondary | ICD-10-CM

## 2023-08-10 DIAGNOSIS — F32A Depression, unspecified: Secondary | ICD-10-CM

## 2023-08-10 DIAGNOSIS — E1159 Type 2 diabetes mellitus with other circulatory complications: Secondary | ICD-10-CM

## 2023-08-10 DIAGNOSIS — E8809 Other disorders of plasma-protein metabolism, not elsewhere classified: Secondary | ICD-10-CM | POA: Insufficient documentation

## 2023-08-10 DIAGNOSIS — E119 Type 2 diabetes mellitus without complications: Secondary | ICD-10-CM

## 2023-08-10 NOTE — Assessment & Plan Note (Signed)
 Paxil  was increased to 30 mg daily at his last appointment for improved treatment of anxiety and depression.  He reports that his mood is stable and anxiety adequately controlled on this regimen.  No additional changes are indicated today.

## 2023-08-10 NOTE — Assessment & Plan Note (Signed)
 Diet controlled.  A1c 6.2 on labs from 5/16.

## 2023-08-10 NOTE — Assessment & Plan Note (Signed)
 Currently prescribed atorvastatin  20 mg daily.  Lipid panel from December 2024 reflects adequate control.

## 2023-08-10 NOTE — Assessment & Plan Note (Signed)
 Albumin level 2.9 on labs from 5/15.  Recommend increasing daily protein intake.  He will add protein supplement drinks.

## 2023-08-10 NOTE — Patient Instructions (Signed)
 It was a pleasure to see you today.  Thank you for giving [place] the opportunity to be involved in your care.  Below is a brief recap of your visit and next steps.  We will plan to see you again in 3 months.  Summary No medication changes today Follow up in 3 months

## 2023-08-10 NOTE — Progress Notes (Signed)
 Established Patient Office Visit  Subjective   Patient ID: Dominic [name], male    DOB: Feb 10, 1941  Age: 83 y.o. MRN: [id number]  Chief Complaint  Patient presents with   Care Management    Three month follow up    Dominic [name] returns to care today for follow-up.  He was last evaluated by me in December 2024 for his annual physical.  Paxil  was increased to 30 mg daily for improved treatment of anxiety and depression, an echocardiogram was ordered in the setting of presyncope with associated dyspnea on exertion, repeat labs ordered.  74-month follow-up was arranged for reassessment.  In the interim, he was admitted to [facility] 5/14 - 5/17 in the setting of acute hypoxic respiratory failure attributed to COPD exacerbation.  Treated with steroids, antibiotics, and nebulizers.  He was weaned to room air prior to discharge.  There have been no acute events since hospital discharge. Dominic [name] reports feeling well today.  He is asymptomatic and has no acute concerns to discuss.  Past Medical History:  Diagnosis Date   Asbestos exposure    Diabetes mellitus without complication (HCC)    Emphysema lung (HCC)    Past Surgical History:  Procedure Laterality Date   APPENDECTOMY     Social History   Tobacco Use   Smoking status: Former    Current packs/day: 0.00    Types: Cigarettes    Quit date: 1980    Years since quitting: 45.4   Smokeless tobacco: Never  Vaping Use   Vaping status: Never Used  Substance Use Topics   Alcohol use: Never   Drug use: Never   Family History  Problem Relation Age of Onset   Lung cancer Mother    Lung cancer Father    Kidney failure Brother    No Known Allergies  Review of Systems  Constitutional:  Negative for chills and fever.  HENT:  Negative for sore throat.   Respiratory:  Negative for cough and shortness of breath.   Cardiovascular:  Negative for chest pain, palpitations and leg swelling.  Gastrointestinal:  Negative for  abdominal pain, blood in stool, constipation, diarrhea, nausea and vomiting.  Genitourinary:  Negative for dysuria and hematuria.  Musculoskeletal:  Negative for myalgias.  Skin:  Negative for itching and rash.  Neurological:  Negative for dizziness and headaches.  Psychiatric/Behavioral:  Negative for depression and suicidal ideas.      Objective:     BP (!) 106/59   Pulse 85   Ht 5\' 11"  (1.803 m)   Wt 149 lb 6.4 oz (67.8 kg)   SpO2 95%   BMI 20.84 kg/m  BP Readings from Last 3 Encounters:  08/10/23 (!) 106/59  07/31/23 105/70  05/10/23 106/68   Physical Exam Vitals reviewed.  Constitutional:      General: He is not in acute distress.    Appearance: Normal appearance. He is not ill-appearing.  HENT:     Head: Normocephalic and atraumatic.     Right Ear: External ear normal.     Left Ear: External ear normal.     Nose: Nose normal. No congestion or rhinorrhea.     Mouth/Throat:     Mouth: Mucous membranes are moist.     Pharynx: Oropharynx is clear.  Eyes:     General: No scleral icterus.    Extraocular Movements: Extraocular movements intact.     Conjunctiva/sclera: Conjunctivae normal.     Pupils: Pupils are equal, round, and reactive to light.  Cardiovascular:     Rate and Rhythm: Normal rate and regular rhythm.     Pulses: Normal pulses.     Heart sounds: Normal heart sounds. No murmur heard. Pulmonary:     Effort: Pulmonary effort is normal.     Breath sounds: Normal breath sounds. No wheezing, rhonchi or rales.  Abdominal:     General: Abdomen is flat. Bowel sounds are normal. There is no distension.     Palpations: Abdomen is soft.     Tenderness: There is no abdominal tenderness.  Musculoskeletal:        General: No swelling or deformity. Normal range of motion.     Cervical back: Normal range of motion.  Skin:    General: Skin is warm and dry.     Capillary Refill: Capillary refill takes less than 2 seconds.  Neurological:     General: No focal  deficit present.     Mental Status: He is alert and oriented to person, place, and time.     Motor: No weakness.  Psychiatric:        Mood and Affect: Mood normal.        Behavior: Behavior normal.        Thought Content: Thought content normal.   Last CBC Lab Results  Component Value Date   WBC 8.8 07/29/2023   HGB 11.5 (L) 07/29/2023   HCT 37.6 (L) 07/29/2023   MCV 88.7 07/29/2023   MCH 27.1 07/29/2023   RDW 15.0 07/29/2023   PLT 243 07/29/2023   Last metabolic panel Lab Results  Component Value Date   GLUCOSE 158 (H) 07/30/2023   NA 134 (L) 07/30/2023   K 4.8 07/30/2023   CL 100 07/30/2023   CO2 26 07/30/2023   BUN 23 07/30/2023   CREATININE 0.68 07/30/2023   GFRNONAA >60 07/30/2023   CALCIUM  9.1 07/30/2023   PHOS 4.0 07/29/2023   PROT 7.3 07/29/2023   ALBUMIN 2.9 (L) 07/29/2023   LABGLOB 3.3 02/15/2023   AGRATIO 1.1 08/28/2022   BILITOT 0.7 07/29/2023   ALKPHOS 102 07/29/2023   AST 27 07/29/2023   ALT 18 07/29/2023   ANIONGAP 8 07/30/2023   Last lipids Lab Results  Component Value Date   CHOL 143 02/15/2023   HDL 44 02/15/2023   LDLCALC 74 02/15/2023   TRIG 140 02/15/2023   CHOLHDL 3.3 02/15/2023   Last hemoglobin A1c Lab Results  Component Value Date   HGBA1C 6.2 (H) 07/30/2023   Last thyroid  functions Lab Results  Component Value Date   TSH 0.565 07/29/2023   Last vitamin D  Lab Results  Component Value Date   VD25OH 75.5 02/15/2023   Last vitamin B12 and Folate Lab Results  Component Value Date   VITAMINB12 >2000 (H) 02/15/2023   FOLATE 7.1 02/15/2023     Assessment & Plan:   Problem List Items Addressed This Visit       Essential hypertension   Losartan  recently discontinued in the setting of orthostasis.  BP remains low-normal today on exam.      COPD GOLD 0  - Primary   Recent hospital admission 5/14 - 5/17 in setting of COPD exacerbation.  Treated with IV steroids, antibiotics, and nebulizers.  Weaned to room air prior to  discharge.  O2 sats are adequate today.  Pulmonary exam is unremarkable and he is asymptomatic currently.  He continues to use albuterol  as needed.  Due for pulmonology follow-up in the setting of recent admission.  Will assist with scheduling  today.      Diabetes mellitus without complication (HCC) (Chronic)   Diet controlled.  A1c 6.2 on labs from 5/16.      Mixed hyperlipidemia   Currently prescribed atorvastatin  20 mg daily.  Lipid panel from December 2024 reflects adequate control.      Anxiety and depression   Paxil  was increased to 30 mg daily at his last appointment for improved treatment of anxiety and depression.  He reports that his mood is stable and anxiety adequately controlled on this regimen.  No additional changes are indicated today.      Hypoalbuminemia   Albumin level 2.9 on labs from 5/15.  Recommend increasing daily protein intake.  He will add protein supplement drinks.       Return in about 3 months (around 11/10/2023).   Tobi Fortes, MD

## 2023-08-10 NOTE — Assessment & Plan Note (Signed)
 Recent hospital admission 5/14 - 5/17 in setting of COPD exacerbation.  Treated with IV steroids, antibiotics, and nebulizers.  Weaned to room air prior to discharge.  O2 sats are adequate today.  Pulmonary exam is unremarkable and he is asymptomatic currently.  He continues to use albuterol  as needed.  Due for pulmonology follow-up in the setting of recent admission.  Will assist with scheduling today.

## 2023-08-10 NOTE — Assessment & Plan Note (Addendum)
 Losartan  recently discontinued in the setting of orthostasis.  BP remains low-normal today on exam.

## 2023-10-01 ENCOUNTER — Other Ambulatory Visit: Payer: Self-pay

## 2023-10-01 DIAGNOSIS — E119 Type 2 diabetes mellitus without complications: Secondary | ICD-10-CM

## 2023-10-01 MED ORDER — ACCU-CHEK SOFTCLIX LANCETS MISC: 1.0000 | Freq: Every day | 12 refills | Status: AC

## 2023-10-01 MED ORDER — ACCU-CHEK GUIDE TEST VI STRP: ORAL_STRIP | 12 refills | Status: DC

## 2023-10-01 MED ORDER — ACCU-CHEK GUIDE ME W/DEVICE KIT: 1.0000 | PACK | Freq: Every day | 0 refills | Status: AC

## 2023-10-05 ENCOUNTER — Other Ambulatory Visit: Payer: Self-pay

## 2023-10-05 DIAGNOSIS — E119 Type 2 diabetes mellitus without complications: Secondary | ICD-10-CM

## 2023-10-05 MED ORDER — ACCU-CHEK GUIDE TEST VI STRP: 1.0000 | ORAL_STRIP | Freq: Every day | 12 refills | Status: AC

## 2023-10-13 ENCOUNTER — Ambulatory Visit: Admitting: Podiatry

## 2023-10-13 ENCOUNTER — Encounter: Payer: Self-pay | Admitting: Podiatry

## 2023-10-13 DIAGNOSIS — M79675 Pain in left toe(s): Secondary | ICD-10-CM

## 2023-10-13 DIAGNOSIS — E119 Type 2 diabetes mellitus without complications: Secondary | ICD-10-CM | POA: Diagnosis not present

## 2023-10-13 DIAGNOSIS — B351 Tinea unguium: Secondary | ICD-10-CM | POA: Diagnosis not present

## 2023-10-13 DIAGNOSIS — M79674 Pain in right toe(s): Secondary | ICD-10-CM | POA: Diagnosis not present

## 2023-10-13 NOTE — Progress Notes (Signed)
 This patient returns to my office for at risk foot care.  This patient requires this care by a professional since this patient will be at risk due to having diabetes.  This patient is unable to cut nails himself since the patient cannot reach his nails.These nails are painful walking and wearing shoes.  He has yellow disfigured nail left big toe.  He was given penlac  previously but he sees no improvement. This patient presents for at risk foot care today.  General Appearance  Alert, conversant and in no acute stress.  Vascular  Dorsalis pedis and posterior tibial  pulses are palpable  bilaterally.  Capillary return is within normal limits  bilaterally. Temperature is within normal limits  bilaterally.  Neurologic  Senn-Weinstein monofilament wire test within normal limits  bilaterally. Muscle power within normal limits bilaterally.  Nails Thick disfigured discolored nails with subungual debris  from hallux to fifth toes bilaterally. No evidence of bacterial infection or drainage bilaterally. His left hallux toenail left foot is yellow, disfigured and unattached to nail bed distally.  Orthopedic  No limitations of motion  feet .  No crepitus or effusions noted.  No bony pathology or digital deformities noted.  Skin  normotropic skin with no porokeratosis noted bilaterally.  No signs of infections or ulcers noted.     Onychomycosis  Pain in right toes  Pain in left toes  Nail Dystrophy left hallux toenail.  Consent was obtained for treatment procedures.   Mechanical debridement of nails 1-5  bilaterally performed with a nail nipper.  Filed with dremel without incident.    Return office visit   3   months                  Told patient to return for periodic foot care and evaluation due to potential at risk complications.     Cordella Bold DPM

## 2023-11-05 ENCOUNTER — Encounter: Payer: Self-pay | Admitting: Radiology

## 2023-11-09 NOTE — Progress Notes (Unsigned)
 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 Fine when resting. Left side of head stays stopped up and drains and makes him have dry cough. Worked in [employer] since 11th grade and was around asbesto.  Dyspnea:  Can't do a whole [employer] / one flight of steps is difficult / mb is 200 ft flat s stopping with sats never lower than 93%  Cough: hoarse, sensation of throat tickles x one year  On acei no mucus prod Sleep: nose stopped up / on back bed is flat/ one pillow  SABA use:  Albuterol  tid not prn - not sure it helps rec Only use your albuterol  as a rescue medication  Also ok  Try albuterol  15 min before an activity  Make sure you check your oxygen saturations at highest level of activity to be sure it stays over 90% and keep track of it at least once a week, more often if breathing getting worse, and let me know if losing ground.  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 He is using his albuterol  inhaler 10 x daily on average.   Dyspnea: much worse x one  week Cough: assoc with brown mucus   no assoc fever,  SABA use: as  above / last used it 30 min prior x 2 puffs, had not been using at all prior to a week prior to OV   02: none / sats  Covid status: vax x 3 rec Plan A = Automatic = Always=    Breztri  Take 2 puffs first thing in am and then another 2 puffs about 12 hours later.  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 He has SOB walking lobby to exam room today.   Dyspnea:  walking at mall x one cirlce x 20 min with lowest sats 85%  Cough: some in am's > brownish yellow x month assoc with nasal congestion  Sleeping: bed  block x 6 in / no resp cc  SABA use: none  02: none   Rec We will walk to qualify you for ambulatory 0xygen > ok to refer for POC eligibility  Make sure you check your oxygen saturation  AT  your highest level of activity (not after you stop)   to be sure it stays over 90%  Omnicef  300 mg twice daily x 10 days should eliminate the brown mucus  For cough / congestion > mucinex  dm up to 1200 mg every 12 hours as needed   Please schedule a follow up visit in 3 months but call sooner if needed    11/03/2022  f/u ov/[place] office/Dominic [name] mz:HNOI 0 COPD /asbestos plaques maint on no rx   Chief Complaint  Patient presents with   Asbestosis  Dyspnea:  walks in [place] x 45 min stop once says  slower pace than others  but that was not the case on walk in office same date  Cough: resolved, nasal  Sleeping: bed blocks no resp cc once asleep SABA use: none  02:  none Rec Mucinex  dm 1200 mg every  12 hours as needed for cough Make sure you check your oxygen saturation at your highest level of activity(NOT after you stop)  to be sure it stays over 90%   Please schedule a follow up visit in 6  months but call sooner if needed   05/10/2023  f/u ov/Dominic [name] re: COPD GOLD 0/ CB    maint on mucinex  dm   Chief Complaint  Patient presents with   Follow-up    6 month follow up  Dyspnea:  walks around rest aeas ok = baseline Cough: orange  Sleeping: bed blocks/ one pillow s  resp cc  02: none Rec For cough/ congestion > mucinex  or mucinex  dm  up to maximum of  1200 mg every 12 hours and use the flutter valve as much as you can   Omnicef  300 mg twice daily x 10 days and follow up with your sinus doctor   Cxr no change    I personally reviewed images and agree with radiology impression as follows:   Chest CTa  07/28/23 1. No evidence of pulmonary embolus. 2. Stable asbestos related pleural disease. 3. Trace bilateral pleural effusions. 4. Aortic Atherosclerosis (ICD10-I70.0) and Emphysema (ICD10-J43.9).           11/10/2023  f/u ov/[place] office/Dominic [name] re: COPD GOLD 0/ CB maint on advair   Chief Complaint  Patient presents with   COPD    Shob/doe - dizzy   Dyspnea:  room to room gradually x months  Cough: none but squeaky voice texture/ some throat clearing  Sleeping: bed blocks and one big pillow s noct   resp cc  SABA use: 80% technique now but still not adding  much to advair 02: none     No obvious day to day or daytime variability or assoc excess/ purulent sputum or mucus plugs or hemoptysis or cp or chest tightness, subjective wheeze or overt sinus or hb symptoms.    Also denies any obvious fluctuation of symptoms with weather or environmental changes or other aggravating or alleviating factors except as outlined above   No unusual exposure hx or h/o childhood pna/ asthma or knowledge of premature birth.  Current Allergies, Complete  Past Medical History, Past Surgical History, Family History, and Social History were reviewed in [facility] record.  ROS  The following are not active complaints unless bolded  Hoarseness, sore throat, dysphagia, dental problems, itching, sneezing,  nasal congestion or discharge of excess mucus or purulent secretions, ear ache,   fever, chills, sweats, unintended wt loss or wt gain, classically pleuritic or exertional cp,  orthopnea pnd or arm/hand swelling  or leg swelling, presyncope, palpitations, abdominal pain, anorexia, nausea, vomiting, diarrhea  or change in bowel habits or change in bladder habits, change in stools or change in urine, dysuria, hematuria,  rash, arthralgias, visual complaints, headache, numbness, weakness or ataxia or problems with walking or coordination,  change in mood or  memory.        Current Meds  Medication Sig   Accu-Chek Softclix Lancets lancets 1 each by Other route daily. Use as instructed   albuterol  (VENTOLIN  HFA) 108 (90 Base) MCG/ACT inhaler Inhale 2 puffs into the lungs every 6 (six) hours as needed for wheezing or shortness of breath.   Ascorbic Acid (VITAMIN C) 100 MG tablet Take 1,000 mg by mouth daily.   atorvastatin  (LIPITOR) 20 MG tablet Take 1 tablet (20 mg total) by mouth daily.   azithromycin  (ZITHROMAX ) 250 MG tablet Take 1 tablet (250 mg total) by mouth daily.   B Complex Vitamins (B COMPLEX 100 PO) Take 1 tablet by mouth daily.   Blood Glucose Monitoring Suppl (ACCU-CHEK GUIDE ME) w/Device KIT 1 kit by Does not apply route daily.   budesonide -glycopyrrolate-formoterol (BREZTRI ) 160-9-4.8 MCG/ACT AERO inhaler Inhale 2 puffs into the lungs 2 (two) times daily.   Cholecalciferol ([employer] VIT D 5000 HIGH-POTENCY PO) Take 1 tablet by mouth 2 (two) times daily.   coenzyme Q10-levOCARNitine (CO Q-10 PLUS) 100-20 MG CAPS Take 200 mg by mouth daily.   famotidine  (PEPCID ) 20 MG tablet TAKE 1 TABLET BY MOUTH DAILY  AFTER SUPPER    glucose blood (ACCU-CHEK GUIDE TEST) test strip 1 each by Other route daily. Use as instructed   guaiFENesin  (MUCINEX ) 600 MG 12 hr tablet Take 600 mg by mouth 2 (two) times daily.   ipratropium (ATROVENT ) 0.06 % nasal spray Place 2 sprays into both nostrils 4 (four) times daily as needed for rhinitis.   loperamide (IMODIUM A-D) 2 MG tablet Take 2 mg by mouth 4 (four) times daily as needed for diarrhea or loose stools.   Magnesium 400 MG CAPS Take by mouth.   pantoprazole  (PROTONIX ) 40 MG tablet TAKE 1 TABLET BY MOUTH DAILY 30  TO 60 MINUTES BEFORE FIRST MEAL  OF THE DAY   PARoxetine  (PAXIL ) 30 MG tablet TAKE 1 TABLET BY MOUTH DAILY        VITAMIN A PO Take 1 tablet by mouth daily.   vitamin B-12 (CYANOCOBALAMIN ) 500 MCG tablet Take 500 mcg by mouth daily.   vitamin E 180 MG (400 UNITS) capsule Take 400 Units by mouth daily.                Past Medical History:  Diagnosis Date   Asbestos exposure    Diabetes mellitus without complication (HCC)    Emphysema lung (HCC)      Objective:    Wt  11/10/2023        147  05/10/2023       155  11/03/2022       161  08/03/2022       159  04/28/2022       161 10/28/2021       165  07/29/2021       163   04/29/2021       178 03/14/2021  189  07/15/2020         193 06/18/2020         191  02/15/20 189 lb (85.7 kg)  11/10/19 185 lb (83.9 kg)  10/05/19 200 lb (90.7 kg)   Vital signs reviewed  11/10/2023  - Note at rest 02 sats  95% on RA   General appearance:    hoarse somewhat frail amb wm     HEENT : Oropharynx  clear      Nasal turbinates nl    NECK :  without  apparent JVD/ palpable Nodes/TM    LUNGS: no acc muscle use,  Nl contour chest with a few crackles in both bases on insp and decreased bs /dullnes R > L base / min exp rhonchi   CV:  RRR  no s3 or murmur or increase in P2, and no edema   ABD:  soft and nontender   MS:  Gait nl   ext warm without deformities Or obvious joint restrictions  calf tenderness, cyanosis or  clubbing    SKIN: warm and dry without lesions    NEURO:  alert, approp, nl sensorium with  no motor or cerebellar deficits apparent.      Labs ordered/ reviewed:      Chemistry      Component Value Date/Time   NA 140 11/10/2023 1552   K 4.8 11/10/2023 1552   CL 101 11/10/2023 1552   CO2 25 11/10/2023 1552   BUN 19 11/10/2023 1552   CREATININE 0.74 (L) 11/10/2023 1552      Component Value Date/Time   CALCIUM  9.4 11/10/2023 1552   ALKPHOS 102 07/29/2023 0428   AST 27 07/29/2023 0428   ALT 18 07/29/2023 0428   BILITOT 0.7 07/29/2023 0428   BILITOT 0.6 02/15/2023 1009        Lab Results  Component Value Date   WBC 7.2 11/10/2023   HGB 13.1 11/10/2023   HCT 41.5 11/10/2023   MCV 87 11/10/2023   PLT 389 11/10/2023     Lab Results  Component Value Date   DDIMER 1.12 (H) 11/10/2023      Lab Results  Component Value Date   TSH 2.490 11/10/2023     BNP  11/10/2023    =  48    Lab Results  Component Value Date   ESRSEDRATE 34 (H) 11/10/2023   ESRSEDRATE 39 (H) 03/14/2021   ESRSEDRATE 67 (H) 02/15/2020        CXR PA and Lateral:   11/10/2023 :    I personally reviewed images and impression is as follows:     No change from prior pleuroparenchymal pattern of scarring R > L base       Assessment   Assessment & Plan DOE (dyspnea on exertion) Onset Sept 2020  03/14/2021   Walked on RA  x  3  lap(s) =  approx 450  ft  @ mod pace, stopped due to end of study by sob on 2nd lap with lowest 02 sats 92%  - 04/29/2021  Patient walked at a moderate pace on room air x 3 laps 150 ft each.  Reported SOB on 2nd and 3rd laps with lowest sats 93%  - DOE labs ok 11/10/2023 x borderline elevated D dimer:  D dimer nl - while high normal value (seen commonly in the elderly or chronically ill)  may miss small peripheral pe, the clot burden with sob is moderately high and the d dimer  of 1.12 here  has a very high neg pred value if used in this setting.   Asbestosis [facility]) Long  h/o exposure hx including working for [employer] in [place] in the ? 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 No definitive imaging findings to suggest asbestosis at this time. 2. Small pulmonary nodules scattered throughout the lungs bilaterally, stable compared to prior examinations, largest of which has a mean diameter of 8 mm in the left lower lobe. Non-contrast chest CT at 12 months is recommended> placed in reminder file for 02/04/21 CT 02/17/21  1. Asbestos related pleural disease. Thin rind of loculated pleural fluid bilaterally, similar on the right and new on the left. Associated basilar predominant parenchymal banding and volume loss, progressive from 04/27/2019. Findings may be due to asbestosis. If further evaluation is desired, high-resolution chest CT without contrast is recommended in future evaluation. 2. Pulmonary nodules are unchanged from 04/27/2019 and considered benign. 3. Cirrhosis. cxr report 07/29/21 R loculated effusion with atx RLL  - 07/29/2021   Walked on RA  x  3  lap(s) =  approx 450  ft  @ fast pace, stopped due to end of study s sob  with lowest 02 sats 91%  - 10/28/2021   Walked on RA  x  3  lap(s) =  approx 450  ft  @ moderate pace, stopped due to end of study  with lowest 02 sats 92% and no sob   - 08/03/2022   Walked on RA  x  3  lap(s) =  approx 450  ft  @ mod   pace, stopped due to end of study  with lowest 02 sats 93% and min sob  - 11/03/2022   Walked on RA  x  3  lap(s) =  approx 450  ft  @ brisk pace, stopped due to end of study  with lowest 02 sats 93% and no sob   - 11/10/2023   Walked on RA  x  3  lap(s) =  approx 450  ft  @ slow pace, stopped due to end of study  with lowest 02 sats 92% and sob on last lap    No obvious explanation for decline other than slowly progressive parenchymal asbestosis with no options for  intervention  - if develops def effusion on top of scarring could offer dx/ therapeutic thoracentesis but not needed now  COPD with acute exacerbation (HCC) Quit smoking 1980  - Emphysema on CT chest 04/27/19 - PFT's  02/06/2020  FEV1 1.65 (55 % ) ratio 0.71  p 4 % improvement from saba p 0 prior to study with DLCO  15.17 (60%) corrects to 3.70 (95%)  for alv volume and FV curve mild concavity  - Labs ordered 02/15/2020  :     alpha one AT phenotype   (since has emphysema on ct)   MM  Level 84   ? Aecopd 1st week in April 2022  - 06/18/2020  After extensive coaching inhaler device,  effectiveness =    75% from a baseline of 25% Plus zpak/ pred x 6 d> no real change in symptom - 11/10/2023 eos 0.4     Each resp  medication was reviewed in detail including emphasizing most importantly the difference between maintenance and prns and under what circumstances the prns are to be triggered using an action plan format where  appropriate.  Total time for H and P, chart review, counseling, reviewing hfa/dpi  device(s) , directly observing portions of ambulatory 02 saturation study/ and generating customized AVS unique to this office visit / same day charting = 34 min         Will continue q 6 m f/u   - call sooner prn         AVS  Patient Instructions  No change in your medication for now   Please remember to go to the lab department   for your tests - we will call you with the results when they are available.      Please remember to go to the  x-ray department  @  [facility] for your tests - we will call you with the results when they are available     Please schedule a follow up office visit in 4 weeks, sooner if needed  with all medications /inhalers/ solutions in hand so we can verify exactly what you are taking. This includes all medications from all doctors and over the counters    Ozell America, MD 11/12/2023

## 2023-11-10 ENCOUNTER — Ambulatory Visit (HOSPITAL_COMMUNITY)
Admission: RE | Admit: 2023-11-10 | Discharge: 2023-11-10 | Disposition: A | Discharge status: Home / Self Care | Source: Ambulatory Visit | Attending: Internal Medicine | Admitting: Internal Medicine

## 2023-11-10 ENCOUNTER — Encounter: Payer: Self-pay | Admitting: Internal Medicine

## 2023-11-10 ENCOUNTER — Ambulatory Visit

## 2023-11-10 ENCOUNTER — Ambulatory Visit: Admitting: Internal Medicine

## 2023-11-10 VITALS — BP 106/69 | HR 94 | Ht 71.0 in | Wt 146.1 lb

## 2023-11-10 VITALS — BP 105/66 | HR 73 | Ht 71.0 in | Wt 147.2 lb

## 2023-11-10 DIAGNOSIS — J441 Chronic obstructive pulmonary disease with (acute) exacerbation: Secondary | ICD-10-CM

## 2023-11-10 DIAGNOSIS — R413 Other amnesia: Secondary | ICD-10-CM

## 2023-11-10 DIAGNOSIS — J329 Chronic sinusitis, unspecified: Secondary | ICD-10-CM

## 2023-11-10 DIAGNOSIS — J449 Chronic obstructive pulmonary disease, unspecified: Secondary | ICD-10-CM

## 2023-11-10 DIAGNOSIS — E559 Vitamin D deficiency, unspecified: Secondary | ICD-10-CM

## 2023-11-10 DIAGNOSIS — M25511 Pain in right shoulder: Secondary | ICD-10-CM | POA: Insufficient documentation

## 2023-11-10 DIAGNOSIS — E119 Type 2 diabetes mellitus without complications: Secondary | ICD-10-CM | POA: Diagnosis not present

## 2023-11-10 DIAGNOSIS — I700 Atherosclerosis of aorta: Secondary | ICD-10-CM | POA: Diagnosis not present

## 2023-11-10 DIAGNOSIS — R0609 Other forms of dyspnea: Secondary | ICD-10-CM

## 2023-11-10 DIAGNOSIS — J61 Pneumoconiosis due to asbestos and other mineral fibers: Secondary | ICD-10-CM | POA: Diagnosis not present

## 2023-11-10 DIAGNOSIS — J439 Emphysema, unspecified: Secondary | ICD-10-CM | POA: Diagnosis not present

## 2023-11-10 DIAGNOSIS — R0602 Shortness of breath: Secondary | ICD-10-CM | POA: Diagnosis not present

## 2023-11-10 NOTE — Progress Notes (Signed)
 Established Patient Office Visit  Subjective   Patient ID: Dominic [name], male    DOB: [birth year]  Age: 83 y.o. MRN: [id number]  Chief Complaint  Patient presents with   Medical Management of Chronic Issues    3 month follow up    HPI  Patient Active Problem List   Diagnosis Date Noted   Memory changes 11/15/2023   Vitamin D  deficiency 11/15/2023   Acute pain of right shoulder 11/15/2023   Hypoalbuminemia 08/10/2023   Elevated brain natriuretic peptide (BNP) level 07/29/2023   Bilateral pleural effusion 07/29/2023   Elevated d-dimer 07/29/2023   History of asbestosis 07/29/2023   Acute exacerbation of chronic obstructive pulmonary disease (COPD) (HCC) 07/28/2023   Dizziness 02/15/2023   Need for influenza vaccination 02/15/2023   Encounter for well adult exam with abnormal findings 02/15/2023   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   Calcaneal spur 11/03/2022   Hallux valgus 11/03/2022   Plantar fasciitis 11/03/2022   Mixed hyperlipidemia 05/12/2022   GERD (gastroesophageal reflux disease) 05/12/2022   Anxiety and depression 05/12/2022   History of diabetes mellitus 05/12/2022   Encounter for general adult medical examination with abnormal findings 05/12/2022   COPD with acute exacerbation (HCC) 06/18/2020   Nasal polyp 02/26/2020   DOE (dyspnea on exertion) 02/15/2020   Chronic sinusitis 02/15/2020   Essential hypertension 11/11/2019   CAP (community acquired pneumonia) 10/05/2019   Diabetes mellitus without complication (HCC)    COPD GOLD 0     Acute respiratory failure with hypoxia (HCC)    Asbestosis (HCC)    Sepsis due to pneumonia (HCC)    Lactic acidosis    Hyponatremia    Hypotension       ROS    Objective:     BP 106/69   Pulse 94   Ht 5' 11 (1.803 m)   Wt 146 lb 1.9 oz (66.3 kg)   SpO2 93%   BMI 20.38 kg/m  BP Readings from Last 3 Encounters:  11/10/23 106/69  11/10/23 105/66  08/10/23 (!) 106/59   Wt Readings from Last 3 Encounters:  11/10/23  146 lb 1.9 oz (66.3 kg)  11/10/23 147 lb 3.2 oz (66.8 kg)  08/10/23 149 lb 6.4 oz (67.8 kg)      Physical Exam Vitals and nursing note reviewed.  Constitutional:      Appearance: Normal appearance.  HENT:     Head: Normocephalic.  Eyes:     Extraocular Movements: Extraocular movements intact.     Pupils: Pupils are equal, round, and reactive to light.  Cardiovascular:     Rate and Rhythm: Normal rate and regular rhythm.  Pulmonary:     Effort: Pulmonary effort is normal.     Breath sounds: Normal breath sounds.  Musculoskeletal:     Cervical back: Normal range of motion and neck supple.  Neurological:     Mental Status: He is alert and oriented to person, place, and time.  Psychiatric:        Mood and Affect: Mood normal.        Thought Content: Thought content normal.      The ASCVD Risk score (Arnett DK, et al., 2019) failed to calculate for the following reasons:   The 2019 ASCVD risk score is only valid for ages 23 to 5    Assessment & Plan:   Problem List Items Addressed This Visit       Other   Memory changes   Check B12 and folate levels for further evaluation  Relevant Orders   B12 and Folate Panel (Completed)   Vitamin D  deficiency - Primary   Recheck vitamin D  levels      Relevant Orders   VITAMIN D  25 Hydroxy (Vit-D Deficiency, Fractures) (Completed)   Acute pain of right shoulder   Obtain x-ray of right shoulder for further evaluation.        Relevant Orders   DG Shoulder Right   Return in about 3 months (around 02/10/2024) for chronic follow-up with PCP.   Leita Longs, FNP

## 2023-11-10 NOTE — Patient Instructions (Signed)
 No change in your medication for now   Please remember to go to the lab department   for your tests - we will call you with the results when they are available.      Please remember to go to the  x-ray department  @  [facility] for your tests - we will call you with the results when they are available     Please schedule a follow up office visit in 4 weeks, sooner if needed  with all medications /inhalers/ solutions in hand so we can verify exactly what you are taking. This includes all medications from all doctors and over the counters

## 2023-11-11 ENCOUNTER — Ambulatory Visit: Admitting: Internal Medicine

## 2023-11-11 LAB — CBC WITH DIFFERENTIAL/PLATELET
Basophils Absolute: 0.1 x10E3/uL (ref 0.0–0.2)
Basos: 1 %
EOS (ABSOLUTE): 0.4 x10E3/uL (ref 0.0–0.4)
Eos: 6 %
Hematocrit: 41.5 % (ref 37.5–51.0)
Hemoglobin: 13.1 g/dL (ref 13.0–17.7)
Immature Grans (Abs): 0 x10E3/uL (ref 0.0–0.1)
Immature Granulocytes: 0 %
Lymphocytes Absolute: 0.9 x10E3/uL (ref 0.7–3.1)
Lymphs: 12 %
MCH: 27.6 pg (ref 26.6–33.0)
MCHC: 31.6 g/dL (ref 31.5–35.7)
MCV: 87 fL (ref 79–97)
Monocytes Absolute: 0.6 x10E3/uL (ref 0.1–0.9)
Monocytes: 8 %
Neutrophils Absolute: 5.2 x10E3/uL (ref 1.4–7.0)
Neutrophils: 73 %
Platelets: 389 x10E3/uL (ref 150–450)
RBC: 4.75 x10E6/uL (ref 4.14–5.80)
RDW: 13.3 % (ref 11.6–15.4)
WBC: 7.2 x10E3/uL (ref 3.4–10.8)

## 2023-11-11 LAB — BASIC METABOLIC PANEL WITH GFR
BUN/Creatinine Ratio: 26 — ABNORMAL HIGH (ref 10–24)
BUN: 19 mg/dL (ref 8–27)
CO2: 25 mmol/L (ref 20–29)
Calcium: 9.4 mg/dL (ref 8.6–10.2)
Chloride: 101 mmol/L (ref 96–106)
Creatinine, Ser: 0.74 mg/dL — ABNORMAL LOW (ref 0.76–1.27)
Glucose: 108 mg/dL — ABNORMAL HIGH (ref 70–99)
Potassium: 4.8 mmol/L (ref 3.5–5.2)
Sodium: 140 mmol/L (ref 134–144)
eGFR: 89 mL/min/1.73 (ref >59)

## 2023-11-11 LAB — D-DIMER, QUANTITATIVE: D-DIMER: 1.12 mg{FEU}/L — ABNORMAL HIGH (ref 0.00–0.49)

## 2023-11-11 LAB — VITAMIN D 25 HYDROXY (VIT D DEFICIENCY, FRACTURES): Vit D, 25-Hydroxy: 55 ng/mL (ref 30.0–100.0)

## 2023-11-11 LAB — SEDIMENTATION RATE: Sed Rate: 34 mm/h — ABNORMAL HIGH (ref 0–30)

## 2023-11-11 LAB — B12 AND FOLATE PANEL
Folate: 11.2 ng/mL (ref >3.0)
Vitamin B-12: 843 pg/mL (ref 232–1245)

## 2023-11-11 LAB — TSH: TSH: 2.49 u[IU]/mL (ref 0.450–4.500)

## 2023-11-11 LAB — BRAIN NATRIURETIC PEPTIDE: BNP: 47.6 pg/mL (ref 0.0–100.0)

## 2023-11-12 ENCOUNTER — Ambulatory Visit: Payer: Self-pay | Admitting: Internal Medicine

## 2023-11-12 NOTE — Assessment & Plan Note (Addendum)
 Quit smoking 1980  - Emphysema on CT chest 04/27/19 - PFT's  02/06/2020  FEV1 1.65 (55 % ) ratio 0.71  p 4 % improvement from saba p 0 prior to study with DLCO  15.17 (60%) corrects to 3.70 (95%)  for alv volume and FV curve mild concavity  - Labs ordered 02/15/2020  :     alpha one AT phenotype   (since has emphysema on ct)   MM  Level 84   ? Aecopd 1st week in April 2022  - 06/18/2020  After extensive coaching inhaler device,  effectiveness =    75% from a baseline of 25% Plus zpak/ pred x 6 d> no real change in symptom - 11/10/2023 eos 0.4     Each resp  medication was reviewed in detail including emphasizing most importantly the difference between maintenance and prns and under what circumstances the prns are to be triggered using an action plan format where appropriate.  Total time for H and P, chart review, counseling, reviewing hfa/dpi  device(s) , directly observing portions of ambulatory 02 saturation study/ and generating customized AVS unique to this office visit / same day charting = 34 min         Will continue q 6 m f/u   - call sooner prn

## 2023-11-12 NOTE — Assessment & Plan Note (Addendum)
 Onset Sept 2020  03/14/2021   Walked on RA  x  3  lap(s) =  approx 450  ft  @ mod pace, stopped due to end of study by sob on 2nd lap with lowest 02 sats 92%  - 04/29/2021  Patient walked at a moderate pace on room air x 3 laps 150 ft each.  Reported SOB on 2nd and 3rd laps with lowest sats 93%  - DOE labs ok 11/10/2023 x borderline elevated D dimer:  D dimer nl - while high normal value (seen commonly in the elderly or chronically ill)  may miss small peripheral pe, the clot burden with sob is moderately high and the d dimer  of 1.12 here  has a very high neg pred value if used in this setting.

## 2023-11-12 NOTE — Assessment & Plan Note (Addendum)
 Long h/o exposure hx including working for [employer] in [place] in the ? 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 No definitive imaging findings to suggest asbestosis at this time. 2. Small pulmonary nodules scattered throughout the lungs bilaterally, stable compared to prior examinations, largest of which has a mean diameter of 8 mm in the left lower lobe. Non-contrast chest CT at 12 months is recommended> placed in reminder file for 02/04/21 CT 02/17/21  1. Asbestos related pleural disease. Thin rind of loculated pleural fluid bilaterally, similar on the right and new on the left. Associated basilar predominant parenchymal banding and volume loss, progressive from 04/27/2019. Findings may be due to asbestosis. If further evaluation is desired, high-resolution chest CT without contrast is recommended in future evaluation. 2. Pulmonary nodules are unchanged from 04/27/2019 and considered benign. 3. Cirrhosis. cxr report 07/29/21 R loculated effusion with atx RLL  - 07/29/2021   Walked on RA  x  3  lap(s) =  approx 450  ft  @ fast pace, stopped due to end of study s sob  with lowest 02 sats 91%  - 10/28/2021   Walked on RA  x  3  lap(s) =  approx 450  ft  @ moderate pace, stopped due to end of study  with lowest 02 sats 92% and no sob   - 08/03/2022   Walked on RA  x  3  lap(s) =  approx 450  ft  @ mod   pace, stopped due to end of study  with lowest 02 sats 93% and min sob  - 11/03/2022   Walked on RA  x  3  lap(s) =  approx 450  ft  @ brisk pace, stopped due to end of study  with lowest 02 sats 93% and no sob   - 11/10/2023   Walked on RA  x  3  lap(s) =  approx 450  ft  @ slow pace, stopped due to end of study  with lowest 02 sats 92% and sob on last lap    No obvious explanation for decline other than slowly progressive parenchymal asbestosis with no options for  intervention  - if develops def effusion on top of scarring could offer dx/ therapeutic thoracentesis but not needed now

## 2023-11-15 ENCOUNTER — Other Ambulatory Visit: Payer: Self-pay | Admitting: Internal Medicine

## 2023-11-15 ENCOUNTER — Ambulatory Visit: Payer: Self-pay

## 2023-11-15 DIAGNOSIS — M25511 Pain in right shoulder: Secondary | ICD-10-CM | POA: Insufficient documentation

## 2023-11-15 DIAGNOSIS — E559 Vitamin D deficiency, unspecified: Secondary | ICD-10-CM | POA: Insufficient documentation

## 2023-11-15 DIAGNOSIS — R413 Other amnesia: Secondary | ICD-10-CM | POA: Insufficient documentation

## 2023-11-15 DIAGNOSIS — F32A Depression, unspecified: Secondary | ICD-10-CM

## 2023-11-15 NOTE — Assessment & Plan Note (Signed)
 Recheck vitamin D  levels

## 2023-11-15 NOTE — Assessment & Plan Note (Signed)
 Obtain x-ray of right shoulder for further evaluation.

## 2023-11-15 NOTE — Assessment & Plan Note (Signed)
 Check B12 and folate levels for further evaluation

## 2023-11-22 NOTE — Progress Notes (Signed)
 Spoke with pt and relayed result. Pt confirmed understanding nfn

## 2023-12-07 ENCOUNTER — Encounter: Payer: Self-pay | Admitting: Internal Medicine

## 2023-12-07 ENCOUNTER — Ambulatory Visit: Admitting: Internal Medicine

## 2023-12-07 VITALS — BP 110/58 | HR 76 | Ht 71.0 in | Wt 147.4 lb

## 2023-12-07 DIAGNOSIS — Z23 Encounter for immunization: Secondary | ICD-10-CM

## 2023-12-07 DIAGNOSIS — J61 Pneumoconiosis due to asbestos and other mineral fibers: Secondary | ICD-10-CM

## 2023-12-07 NOTE — Assessment & Plan Note (Addendum)
 Long h/o exposure hx including working for [employer] in [place] in the ? 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 No definitive imaging findings to suggest asbestosis at this time. 2. Small pulmonary nodules scattered throughout the lungs bilaterally, stable compared to prior examinations, largest of which has a mean diameter of 8 mm in the left lower lobe. Non-contrast chest CT at 12 months is recommended> placed in reminder file for 02/04/21 CT 02/17/21  1. Asbestos related pleural disease. Thin rind of loculated pleural fluid bilaterally, similar on the right and new on the left. Associated basilar predominant parenchymal banding and volume loss, progressive from 04/27/2019. Findings may be due to asbestosis. If further evaluation is desired, high-resolution chest CT without contrast is recommended in future evaluation. 2. Pulmonary nodules are unchanged from 04/27/2019 and considered benign. 3. Cirrhosis. cxr report 07/29/21 R loculated effusion with atx RLL  - 07/29/2021   Walked on RA  x  3  lap(s) =  approx 450  ft  @ fast pace, stopped due to end of study s sob  with lowest 02 sats 91%  - 10/28/2021   Walked on RA  x  3  lap(s) =  approx 450  ft  @ moderate pace, stopped due to end of study  with lowest 02 sats 92% and no sob   - 08/03/2022   Walked on RA  x  3  lap(s) =  approx 450  ft  @ mod   pace, stopped due to end of study  with lowest 02 sats 93% and min sob  - 11/03/2022   Walked on RA  x  3  lap(s) =  approx 450  ft  @ brisk pace, stopped due to end of study  with lowest 02 sats 93% and no sob   - 11/10/2023   Walked on RA  x  3  lap(s) =  approx 450  ft  @ slow pace, stopped due to end of study  with lowest 02 sats 92% and sob on last lap   -Chest CTa  07/28/23 1. No evidence of pulmonary embolus. 2. Stable asbestos related pleural disease. 3. Trace  bilateral pleural effusions. 4. Aortic Atherosclerosis (ICD10-I70.0) and Emphysema (ICD10-J43.9).      - 12/07/2023   Walked on RA  x  3  lap(s) =  approx 450  ft  @ mod pace, stopped due to end of study  with lowest 02 sats 95% and min sob    No evidence of ILD related to asbestos exp   Rec yearly f/u but weekly checks on peak ex 02 sats and call if trending ground  Discussed in detail all the  indications, usual  risks and alternatives  relative to the benefits with patient who agrees to proceed with conservative f/u as outlined     Each maintenance medication was reviewed in detail including emphasizing most importantly the difference between maintenance and prns and under what circumstances the prns are to be triggered using an action plan format where appropriate.  Total time for H and P, chart review, counseling,  directly observing portions of ambulatory 02 saturation study/ and generating customized AVS unique to this office visit / same day charting = 31 min

## 2023-12-07 NOTE — Assessment & Plan Note (Signed)
-   high dose flu vaccine adm today

## 2023-12-07 NOTE — Patient Instructions (Signed)
Make sure you check your oxygen saturation  AT  your highest level of activity (not after you stop)   to be sure it stays over 90% and adjust  02 flow upward to maintain this level if needed but remember to turn it back to previous settings when you stop (to conserve your supply).  ° ° °Please schedule a follow up visit in 12 months but call sooner if needed  °

## 2023-12-07 NOTE — Progress Notes (Addendum)
 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 Fine when resting. Left side of head stays stopped up and drains and makes him have dry cough. Worked in [employer] since 11th grade and was around asbesto.  Dyspnea:  Can't do a [employer] / one flight of steps is difficult / mb is 200 ft flat s stopping with sats never lower than 93%  Cough: hoarse, sensation of throat tickles x one year  On acei no mucus prod Sleep: nose stopped up / on back bed is flat/ one pillow  SABA use:  Albuterol  tid not prn - not sure it helps rec Only use your albuterol  as a rescue medication  Also ok  Try albuterol  15 min before an activity  Make sure you check your oxygen saturations at highest level of activity to be sure it stays over 90% and keep track of it at least once a week, more often if breathing getting worse, and let me know if losing ground.  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 He is using his albuterol  inhaler 10 x daily on average.   Dyspnea: much worse x one  week Cough: assoc with brown mucus   no assoc fever,  SABA use: as  above / last used it 30 min prior x 2 puffs, had not been using at all prior to a week prior to OV   02: none / sats  Covid status: vax x 3 rec Plan A = Automatic = Always=    Breztri  Take 2 puffs first thing in am and then another 2 puffs about 12 hours later.  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 He has SOB walking lobby to exam room today.   Dyspnea:  walking at mall x one cirlce x 20 min with lowest sats 85%  Cough: some in am's > brownish yellow x month assoc with nasal congestion  Sleeping: bed  block x 6 in / no resp cc  SABA use: none  02: none   Rec We will walk to qualify you for ambulatory 0xygen > ok to refer for POC eligibility  Make sure you check your oxygen saturation  AT  your highest level of activity (not after you stop)   to be sure it stays over 90%  Omnicef  300 mg twice daily x 10 days should eliminate the brown mucus  For cough / congestion > mucinex  dm up to 1200 mg every 12 hours as needed      Chest CTa  07/28/23 1. No evidence of pulmonary embolus. 2. Stable asbestos related pleural disease. 3. Trace bilateral pleural effusions. 4. Aortic Atherosclerosis (ICD10-I70.0) and Emphysema (ICD10-J43.9).           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 Chronic blunting of bilateral costophrenic angles. Bilateral calcified pleural plaques. Stable left apical pleural thickening.      12/07/2023  f/u ov/[facility] office/Adeola [name] re: GOLD 0 copb/ CB asbestos related pleural disease. maint on gerd rx / stopped advair and no change in doe   Chief Complaint  Patient presents with   Medical Management of Chronic Issues    Has some head congestion when he wakes up in the am- gets better after he starts moving around.   Dyspnea:  more limited by balance, ataxia, dizzyness  Cough: none Sleeping: bed blocks/ on big pillow s    resp cc  SABA use: none  02: no      No obvious day to day or daytime variability or assoc excess/ purulent sputum or mucus plugs or hemoptysis or cp or chest tightness, subjective wheeze or overt  hb symptoms.    Also denies any obvious fluctuation of symptoms with weather or environmental changes or other aggravating or alleviating factors except as outlined above   No unusual exposure hx or h/o childhood pna/ asthma or knowledge of premature birth.  Current Allergies, Complete Past Medical History, Past Surgical History, Family History, and Social History were reviewed in [facility] record.  ROS  The following are not active complaints unless bolded Hoarseness, sore throat, dysphagia, dental problems, itching, sneezing,  nasal congestion or discharge of excess mucus or purulent secretions, ear ache,   fever, chills, sweats, unintended wt loss or wt gain, classically pleuritic or exertional cp,  orthopnea pnd or arm/hand swelling  or leg swelling, presyncope, palpitations, abdominal  pain, anorexia, nausea, vomiting, diarrhea  or change in bowel habits or change in bladder habits, change in stools or change in urine, dysuria, hematuria,  rash, arthralgias, visual complaints, headache, numbness, weakness or ataxia or problems with walking or coordination,  change in mood or  memory.        Current Meds  Medication Sig   Accu-Chek Softclix Lancets lancets 1 each by Other route daily. Use as  instructed   Ascorbic Acid (VITAMIN C) 100 MG tablet Take 1,000 mg by mouth daily.   atorvastatin  (LIPITOR) 20 MG tablet Take 1 tablet (20 mg total) by mouth daily.   Blood Glucose Monitoring Suppl (ACCU-CHEK GUIDE ME) w/Device KIT 1 kit by Does not apply route daily.   Cholecalciferol ([employer] VIT D 5000 HIGH-POTENCY PO) Take 1 tablet by mouth 2 (two) times daily.   coenzyme Q10-levOCARNitine (CO Q-10 PLUS) 100-20 MG CAPS Take 200 mg by mouth daily.   famotidine  (PEPCID ) 20 MG tablet TAKE 1 TABLET BY MOUTH DAILY  AFTER SUPPER   glucose blood (ACCU-CHEK GUIDE TEST) test strip 1 each by Other route daily. Use as instructed   guaiFENesin  (MUCINEX ) 600 MG 12 hr tablet Take 600 mg by mouth 2 (two) times daily.   MAGnesium-Oxide 400 (240 Mg) MG tablet Take 400 mg by mouth daily.   Melatonin 10 MG TABS Take 1 tablet by mouth at bedtime.   pantoprazole  (PROTONIX ) 40 MG tablet TAKE 1 TABLET BY MOUTH DAILY 30  TO 60 MINUTES BEFORE FIRST MEAL  OF THE DAY   PARoxetine  (PAXIL ) 30 MG tablet TAKE 1 TABLET BY MOUTH DAILY   vitamin B-12 (CYANOCOBALAMIN ) 500 MCG tablet Take 500 mcg by mouth daily.   vitamin E 180 MG (400 UNITS) capsule Take 400 Units by mouth daily.                Past Medical History:  Diagnosis Date   Asbestos exposure    Diabetes mellitus without complication (HCC)    Emphysema lung (HCC)      Objective:    Wt  12/07/2023  11/10/2023        147  05/10/2023       155  11/03/2022       161  08/03/2022       159  04/28/2022       161 10/28/2021       165  07/29/2021       163    04/29/2021       178 03/14/2021     189  07/15/2020         193 06/18/2020         191  02/15/20 189 lb (85.7 kg)  11/10/19 185 lb (83.9 kg)  10/05/19 200 lb (90.7 kg)    Vital signs reviewed  12/07/2023  - Note at rest 02 sats  96% on RA   General appearance:    hoarse amb wm nad      HEENT : Oropharynx  nl      Nasal turbinates nl    NECK :  without  apparent JVD/ palpable Nodes/TM    LUNGS: no acc muscle use,  Nl contour chest with a few insp crackles bases s cough     CV:  RRR  no s3 or murmur or increase in P2, and no edema   ABD:  soft and nontender   MS:  Gait nl   ext warm without deformities Or obvious joint restrictions  calf tenderness, cyanosis or clubbing    SKIN: warm and dry without lesions    NEURO:  alert, approp, nl sensorium with  no motor or cerebellar deficits apparent.            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 No definitive imaging findings to suggest asbestosis at this time. 2. Small pulmonary nodules scattered throughout the lungs bilaterally, stable compared to prior examinations, largest of which has a mean diameter of 8 mm in the left lower lobe. Non-contrast chest CT at 12 months is recommended> placed in reminder file for 02/04/21 CT 02/17/21  1. Asbestos related pleural disease. Thin rind of loculated pleural fluid bilaterally, similar on the right and new on the left. Associated basilar predominant parenchymal banding and volume loss, progressive from 04/27/2019. Findings may be due to asbestosis. If further evaluation is desired, high-resolution chest CT without contrast is recommended in future  evaluation. 2. Pulmonary nodules are unchanged from 04/27/2019 and considered benign. 3. Cirrhosis. cxr report 07/29/21 R loculated effusion with atx RLL  - 07/29/2021   Walked on RA  x  3  lap(s) =  approx 450  ft  @ fast pace, stopped due to end of study s sob  with lowest 02 sats 91%  - 10/28/2021   Walked on RA  x  3  lap(s) =  approx 450  ft  @ moderate pace, stopped due to end of study  with lowest 02 sats 92% and no sob   - 08/03/2022   Walked on RA  x  3  lap(s) =  approx 450  ft  @ mod   pace, stopped due to end of study  with lowest 02 sats 93% and min sob  - 11/03/2022   Walked on RA  x  3  lap(s) =  approx 450  ft  @ brisk pace, stopped due to end of study  with lowest 02 sats 93% and no sob   - 11/10/2023   Walked on RA  x  3  lap(s) =  approx 450  ft  @ slow pace, stopped due to end of study  with lowest 02 sats 92% and sob on last lap   -Chest CTa  07/28/23 1. No evidence of pulmonary embolus. 2. Stable asbestos related pleural disease. 3. Trace bilateral pleural effusions. 4. Aortic Atherosclerosis (ICD10-I70.0) and Emphysema (ICD10-J43.9).      - 12/07/2023   Walked on RA  x  3  lap(s) =  approx 450  ft  @ mod pace, stopped due to end of study  with lowest 02 sats 95% and min sob    No evidence of ILD related to asbestos exp   Rec yearly f/u but weekly checks on peak ex 02 sats and call if trending ground  Discussed in detail all the  indications, usual  risks and alternatives  relative to the benefits with patient who agrees to proceed with conservative f/u as outlined     Each maintenance medication was reviewed in detail including emphasizing most importantly the difference between maintenance and prns and under what circumstances the prns are to be triggered using an action plan format where appropriate.  Total time for H and P, chart review, counseling,  directly observing portions of ambulatory 02 saturation study/ and generating customized AVS unique to this office visit /  same day charting = 31 min          AVS  Patient Instructions  Make sure you check your oxygen saturation  AT  your highest level of activity (not after you  stop)   to be sure it stays over 90% and adjust  02 flow upward to maintain this level if needed but remember to turn it back to previous settings when you stop (to conserve your supply).    Please schedule a follow up visit in `12  months but call sooner if needed    Ozell America, MD 12/07/2023

## 2023-12-27 NOTE — Progress Notes (Signed)
 Carel Carrier                                          MRN: [id number]   12/27/2023   The [facility] Quality Team Specialist reviewed this patient medical record for the purposes of chart review for care gap closure. The following were reviewed: chart review for care gap closure-kidney health evaluation for diabetes:eGFR  and uACR.    [facility] Quality Team

## 2024-01-13 ENCOUNTER — Encounter: Payer: Self-pay | Admitting: Podiatry

## 2024-01-13 ENCOUNTER — Ambulatory Visit: Admitting: Podiatry

## 2024-01-13 DIAGNOSIS — M79674 Pain in right toe(s): Secondary | ICD-10-CM

## 2024-01-13 DIAGNOSIS — B351 Tinea unguium: Secondary | ICD-10-CM | POA: Diagnosis not present

## 2024-01-13 DIAGNOSIS — M79675 Pain in left toe(s): Secondary | ICD-10-CM | POA: Diagnosis not present

## 2024-01-13 DIAGNOSIS — L603 Nail dystrophy: Secondary | ICD-10-CM | POA: Diagnosis not present

## 2024-01-13 DIAGNOSIS — E119 Type 2 diabetes mellitus without complications: Secondary | ICD-10-CM | POA: Diagnosis not present

## 2024-01-13 NOTE — Progress Notes (Signed)
 This patient returns to my office for at risk foot care.  This patient requires this care by a professional since this patient will be at risk due to having diabetes.  This patient is unable to cut nails himself since the patient cannot reach his nails.These nails are painful walking and wearing shoes.  He has yellow disfigured nail left big toe.  He was given penlac  previously but he sees no improvement. This patient presents for at risk foot care today.  General Appearance  Alert, conversant and in no acute stress.  Vascular  Dorsalis pedis and posterior tibial  pulses are palpable  bilaterally.  Capillary return is within normal limits  bilaterally. Temperature is within normal limits  bilaterally.  Neurologic  Senn-Weinstein monofilament wire test within normal limits  bilaterally. Muscle power within normal limits bilaterally.  Nails Thick disfigured discolored nails with subungual debris  from hallux to fifth toes bilaterally. No evidence of bacterial infection or drainage bilaterally. His left hallux toenail left foot is yellow, disfigured and unattached to nail bed distally and medially.  Orthopedic  No limitations of motion  feet .  No crepitus or effusions noted.  No bony pathology or digital deformities noted.  Skin  normotropic skin with no porokeratosis noted bilaterally.  No signs of infections or ulcers noted.     Onychomycosis  Pain in right toes  Pain in left toes  Nail Dystrophy left hallux toenail.  Consent was obtained for treatment procedures.   Mechanical debridement of nails 1-5  bilaterally performed with a nail nipper.  Filed with dremel without incident.    Return office visit   3   months                  Told patient to return for periodic foot care and evaluation due to potential at risk complications.     Cordella Bold DPM

## 2024-01-17 ENCOUNTER — Encounter: Payer: Self-pay | Admitting: Radiology

## 2024-01-18 ENCOUNTER — Other Ambulatory Visit: Payer: Self-pay | Admitting: Internal Medicine

## 2024-02-22 ENCOUNTER — Other Ambulatory Visit: Payer: Self-pay | Admitting: Internal Medicine

## 2024-04-14 ENCOUNTER — Ambulatory Visit: Admitting: Podiatry

## 2024-06-27 ENCOUNTER — Ambulatory Visit
